# Patient Record
Sex: Male | Born: 1960 | Race: Black or African American | Hispanic: No | Marital: Married | Smoking: Never smoker
Health system: Southern US, Community
[De-identification: ages and names within clinical notes are randomized; demographics above are authoritative.]

## PROBLEM LIST (undated history)

## (undated) DIAGNOSIS — R002 Palpitations: Secondary | ICD-10-CM

## (undated) DIAGNOSIS — Z9989 Dependence on other enabling machines and devices: Secondary | ICD-10-CM

## (undated) DIAGNOSIS — G4733 Obstructive sleep apnea (adult) (pediatric): Secondary | ICD-10-CM

## (undated) DIAGNOSIS — K219 Gastro-esophageal reflux disease without esophagitis: Secondary | ICD-10-CM

## (undated) DIAGNOSIS — F419 Anxiety disorder, unspecified: Secondary | ICD-10-CM

## (undated) DIAGNOSIS — I82409 Acute embolism and thrombosis of unspecified deep veins of unspecified lower extremity: Secondary | ICD-10-CM

## (undated) HISTORY — DX: Palpitations: R00.2

## (undated) HISTORY — DX: Anxiety disorder, unspecified: F41.9

## (undated) HISTORY — DX: Acute embolism and thrombosis of unspecified deep veins of unspecified lower extremity: I82.409

## (undated) HISTORY — DX: Gastro-esophageal reflux disease without esophagitis: K21.9

## (undated) HISTORY — PX: NASAL SEPTOPLASTY W/ TURBINOPLASTY: SHX2070

## (undated) HISTORY — DX: Dependence on other enabling machines and devices: Z99.89

## (undated) HISTORY — PX: TONSILLECTOMY: SUR1361

## (undated) HISTORY — DX: Obstructive sleep apnea (adult) (pediatric): G47.33

---

## 2003-03-23 ENCOUNTER — Encounter: Payer: Self-pay | Admitting: Internal Medicine

## 2003-04-10 ENCOUNTER — Ambulatory Visit (HOSPITAL_BASED_OUTPATIENT_CLINIC_OR_DEPARTMENT_OTHER): Admission: RE | Admit: 2003-04-10 | Discharge: 2003-04-10 | Payer: Self-pay | Admitting: Internal Medicine

## 2004-01-19 ENCOUNTER — Ambulatory Visit: Payer: Self-pay | Admitting: Family Medicine

## 2004-02-21 ENCOUNTER — Ambulatory Visit: Payer: Self-pay | Admitting: Family Medicine

## 2004-02-21 ENCOUNTER — Ambulatory Visit (HOSPITAL_COMMUNITY): Admission: RE | Admit: 2004-02-21 | Discharge: 2004-02-21 | Payer: Self-pay | Admitting: Family Medicine

## 2004-02-26 ENCOUNTER — Ambulatory Visit: Payer: Self-pay | Admitting: Family Medicine

## 2004-02-29 ENCOUNTER — Ambulatory Visit: Payer: Self-pay | Admitting: Internal Medicine

## 2004-03-04 ENCOUNTER — Ambulatory Visit: Payer: Self-pay | Admitting: Internal Medicine

## 2004-03-11 ENCOUNTER — Ambulatory Visit: Payer: Self-pay | Admitting: Internal Medicine

## 2004-03-25 ENCOUNTER — Ambulatory Visit: Payer: Self-pay | Admitting: Family Medicine

## 2004-04-22 ENCOUNTER — Ambulatory Visit: Payer: Self-pay | Admitting: Family Medicine

## 2004-05-24 ENCOUNTER — Ambulatory Visit: Payer: Self-pay | Admitting: Family Medicine

## 2004-06-24 ENCOUNTER — Ambulatory Visit: Payer: Self-pay | Admitting: Family Medicine

## 2004-08-21 ENCOUNTER — Emergency Department (HOSPITAL_COMMUNITY): Admission: EM | Admit: 2004-08-21 | Discharge: 2004-08-21 | Payer: Self-pay | Admitting: Emergency Medicine

## 2004-08-23 ENCOUNTER — Ambulatory Visit: Payer: Self-pay | Admitting: Internal Medicine

## 2004-10-29 ENCOUNTER — Ambulatory Visit: Payer: Self-pay | Admitting: Internal Medicine

## 2005-07-16 ENCOUNTER — Ambulatory Visit (HOSPITAL_COMMUNITY): Admission: RE | Admit: 2005-07-16 | Discharge: 2005-07-17 | Payer: Self-pay | Admitting: Otolaryngology

## 2005-07-16 ENCOUNTER — Encounter (INDEPENDENT_AMBULATORY_CARE_PROVIDER_SITE_OTHER): Payer: Self-pay | Admitting: *Deleted

## 2006-03-12 ENCOUNTER — Encounter: Payer: Self-pay | Admitting: Internal Medicine

## 2006-06-02 ENCOUNTER — Encounter: Payer: Self-pay | Admitting: Internal Medicine

## 2006-07-09 DIAGNOSIS — G4733 Obstructive sleep apnea (adult) (pediatric): Secondary | ICD-10-CM | POA: Insufficient documentation

## 2006-07-09 DIAGNOSIS — J309 Allergic rhinitis, unspecified: Secondary | ICD-10-CM | POA: Insufficient documentation

## 2006-07-09 DIAGNOSIS — I82409 Acute embolism and thrombosis of unspecified deep veins of unspecified lower extremity: Secondary | ICD-10-CM | POA: Insufficient documentation

## 2006-07-23 IMAGING — CT CT ANGIO CHEST
1 of 6 series · 5 of 36 positions shown · IV contrast (omnipaque)
Comparison: Comparing chest radiograph of 08/21/04.

CLINICAL DATA: Left scapular pain.  History of DVT six months ago.  Shortness of breath.  
 CT ANGIOGRAPHY OF CHEST:
TECHNIQUE: Multidetector CT imaging of the chest was performed during bolus injection of intravenous contrast.  Multiplanar CT angiographic image reconstructions were generated to evaluate the vascular anatomy.
 Contrast:  100 cc Omnipaque 300.

[Series 3: pe 1.0 b20f st · axial · 0.63mm/px · z∈[-127,+81]mm · 5 of 312 slices shown]
[im 52/312  lung]
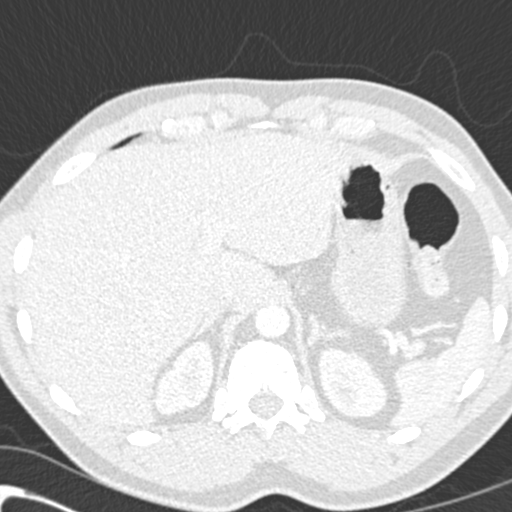
[im 104/312  mediastinal]
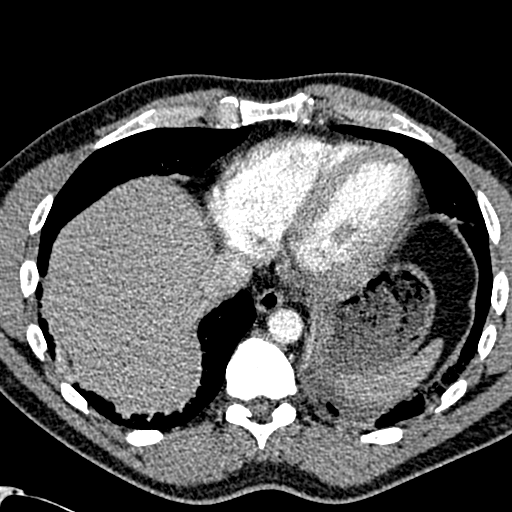
[im 156/312  lung]
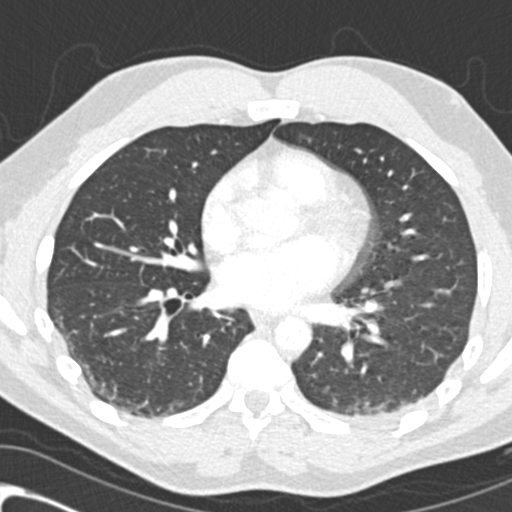
[im 208/312  mediastinal]
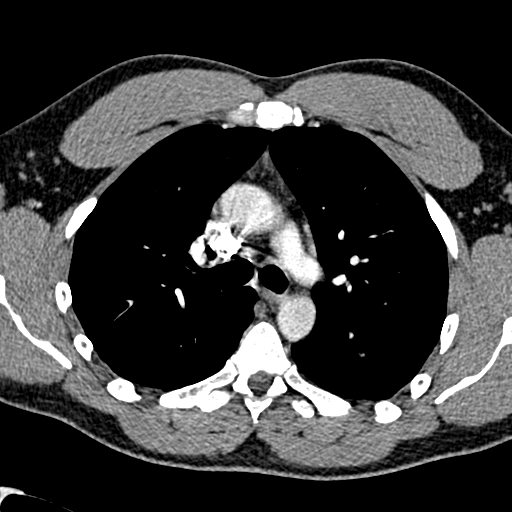
[im 260/312  lung]
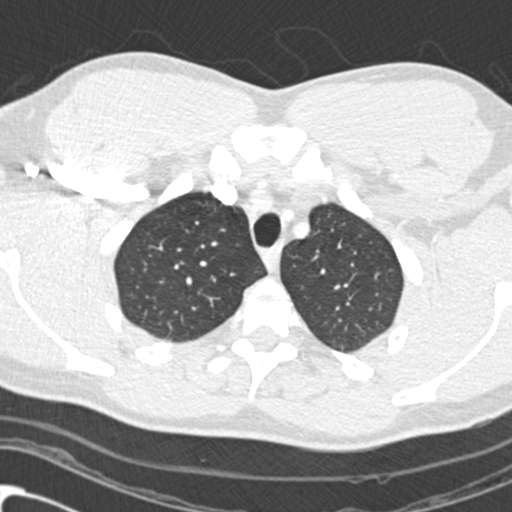

[5 of 36 positions shown; findings below may reference images not displayed]

FINDINGS: No filling defect is identified in the pulmonary arterial tree to suggest pulmonary embolus.  No aortic dissection is identified.  No mediastinal or hilar adenopathy.  
 There is some linear subsegmental atelectasis in the posterior basal segment of the right lower lobe.
 There is some ill-defined peripheral airspace opacity in the posterior basal segment in the left lower lobe suspicious for pneumonia or possibly atelectasis.
IMPRESSION: 1.  Left lower lobe airspace opacity suspicious for pneumonia and less likely to simply be a manifestation of atelectasis.
 2.  Mild basilar atelectasis.
 3.  No embolus is evident.

## 2006-08-24 ENCOUNTER — Encounter: Payer: Self-pay | Admitting: Internal Medicine

## 2006-10-14 ENCOUNTER — Encounter: Payer: Self-pay | Admitting: Internal Medicine

## 2006-10-18 ENCOUNTER — Emergency Department (HOSPITAL_COMMUNITY): Admission: EM | Admit: 2006-10-18 | Discharge: 2006-10-18 | Payer: Self-pay | Admitting: Emergency Medicine

## 2006-10-26 ENCOUNTER — Encounter: Payer: Self-pay | Admitting: Internal Medicine

## 2006-11-05 ENCOUNTER — Ambulatory Visit: Payer: Self-pay | Admitting: Internal Medicine

## 2006-11-05 DIAGNOSIS — K219 Gastro-esophageal reflux disease without esophagitis: Secondary | ICD-10-CM | POA: Insufficient documentation

## 2006-11-27 ENCOUNTER — Encounter: Payer: Self-pay | Admitting: Internal Medicine

## 2006-12-21 ENCOUNTER — Encounter: Payer: Self-pay | Admitting: Internal Medicine

## 2006-12-28 ENCOUNTER — Encounter: Payer: Self-pay | Admitting: Internal Medicine

## 2007-02-08 ENCOUNTER — Emergency Department (HOSPITAL_COMMUNITY): Admission: EM | Admit: 2007-02-08 | Discharge: 2007-02-08 | Payer: Self-pay | Admitting: Emergency Medicine

## 2007-02-22 ENCOUNTER — Encounter: Payer: Self-pay | Admitting: Internal Medicine

## 2007-02-26 ENCOUNTER — Ambulatory Visit: Payer: Self-pay | Admitting: Internal Medicine

## 2007-02-26 DIAGNOSIS — R002 Palpitations: Secondary | ICD-10-CM | POA: Insufficient documentation

## 2007-02-26 DIAGNOSIS — F411 Generalized anxiety disorder: Secondary | ICD-10-CM | POA: Insufficient documentation

## 2007-03-10 ENCOUNTER — Encounter: Payer: Self-pay | Admitting: Internal Medicine

## 2007-05-26 ENCOUNTER — Encounter (INDEPENDENT_AMBULATORY_CARE_PROVIDER_SITE_OTHER): Payer: Self-pay | Admitting: *Deleted

## 2007-06-11 ENCOUNTER — Ambulatory Visit: Payer: Self-pay | Admitting: Internal Medicine

## 2007-08-23 ENCOUNTER — Encounter: Payer: Self-pay | Admitting: Internal Medicine

## 2007-08-24 ENCOUNTER — Encounter: Payer: Self-pay | Admitting: Internal Medicine

## 2007-11-19 ENCOUNTER — Telehealth (INDEPENDENT_AMBULATORY_CARE_PROVIDER_SITE_OTHER): Payer: Self-pay | Admitting: *Deleted

## 2007-11-19 ENCOUNTER — Emergency Department (HOSPITAL_COMMUNITY): Admission: EM | Admit: 2007-11-19 | Discharge: 2007-11-19 | Payer: Self-pay | Admitting: Emergency Medicine

## 2007-12-30 ENCOUNTER — Encounter (INDEPENDENT_AMBULATORY_CARE_PROVIDER_SITE_OTHER): Payer: Self-pay | Admitting: *Deleted

## 2008-01-07 ENCOUNTER — Ambulatory Visit: Payer: Self-pay | Admitting: Internal Medicine

## 2008-01-07 DIAGNOSIS — R229 Localized swelling, mass and lump, unspecified: Secondary | ICD-10-CM | POA: Insufficient documentation

## 2008-03-02 ENCOUNTER — Ambulatory Visit: Payer: Self-pay | Admitting: Internal Medicine

## 2008-03-06 ENCOUNTER — Encounter: Payer: Self-pay | Admitting: Internal Medicine

## 2008-03-07 ENCOUNTER — Encounter (INDEPENDENT_AMBULATORY_CARE_PROVIDER_SITE_OTHER): Payer: Self-pay | Admitting: *Deleted

## 2008-03-07 LAB — CONVERTED CEMR LAB
BUN: 11 mg/dL (ref 6–23)
Basophils Absolute: 0 10*3/uL (ref 0.0–0.1)
Basophils Relative: 0 % (ref 0.0–3.0)
CO2: 28 meq/L (ref 19–32)
Calcium: 9.3 mg/dL (ref 8.4–10.5)
Chloride: 108 meq/L (ref 96–112)
Cholesterol: 109 mg/dL (ref 0–200)
Creatinine, Ser: 0.9 mg/dL (ref 0.4–1.5)
Eosinophils Absolute: 0.1 10*3/uL (ref 0.0–0.7)
Eosinophils Relative: 1.4 % (ref 0.0–5.0)
GFR calc Af Amer: 117 mL/min
GFR calc non Af Amer: 97 mL/min
Glucose, Bld: 90 mg/dL (ref 70–99)
HCT: 39.2 % (ref 39.0–52.0)
HDL: 39.7 mg/dL (ref >39.0)
Hemoglobin: 13.1 g/dL (ref 13.0–17.0)
LDL Cholesterol: 59 mg/dL (ref 0–99)
Lymphocytes Relative: 35.9 % (ref 12.0–46.0)
MCHC: 33.3 g/dL (ref 30.0–36.0)
MCV: 93.7 fL (ref 78.0–100.0)
Monocytes Absolute: 0.4 10*3/uL (ref 0.1–1.0)
Monocytes Relative: 10.9 % (ref 3.0–12.0)
Neutro Abs: 1.9 10*3/uL (ref 1.4–7.7)
Neutrophils Relative %: 51.8 % (ref 43.0–77.0)
Platelets: 150 10*3/uL (ref 150–400)
Potassium: 4.4 meq/L (ref 3.5–5.1)
RBC: 4.18 M/uL — ABNORMAL LOW (ref 4.22–5.81)
RDW: 11.6 % (ref 11.5–14.6)
Sodium: 142 meq/L (ref 135–145)
Total CHOL/HDL Ratio: 2.7
Triglycerides: 51 mg/dL (ref 0–149)
VLDL: 10 mg/dL (ref 0–40)
WBC: 3.8 10*3/uL — ABNORMAL LOW (ref 4.5–10.5)

## 2008-03-26 ENCOUNTER — Emergency Department (HOSPITAL_COMMUNITY): Admission: EM | Admit: 2008-03-26 | Discharge: 2008-03-26 | Payer: Self-pay | Admitting: Emergency Medicine

## 2008-04-27 ENCOUNTER — Telehealth: Payer: Self-pay | Admitting: Internal Medicine

## 2008-05-12 ENCOUNTER — Encounter: Payer: Self-pay | Admitting: Internal Medicine

## 2008-08-14 ENCOUNTER — Encounter: Payer: Self-pay | Admitting: Internal Medicine

## 2008-09-05 ENCOUNTER — Encounter (INDEPENDENT_AMBULATORY_CARE_PROVIDER_SITE_OTHER): Payer: Self-pay | Admitting: *Deleted

## 2008-09-05 ENCOUNTER — Ambulatory Visit: Payer: Self-pay | Admitting: Internal Medicine

## 2009-01-09 IMAGING — CR DG CHEST 2V
2 series · 2 of 2 positions shown · non-contrast
Comparison: 07/10/05.

CLINICAL DATA: Palpitations and light-headedness.  
 CHEST - 2 VIEW:

[w chest pa]
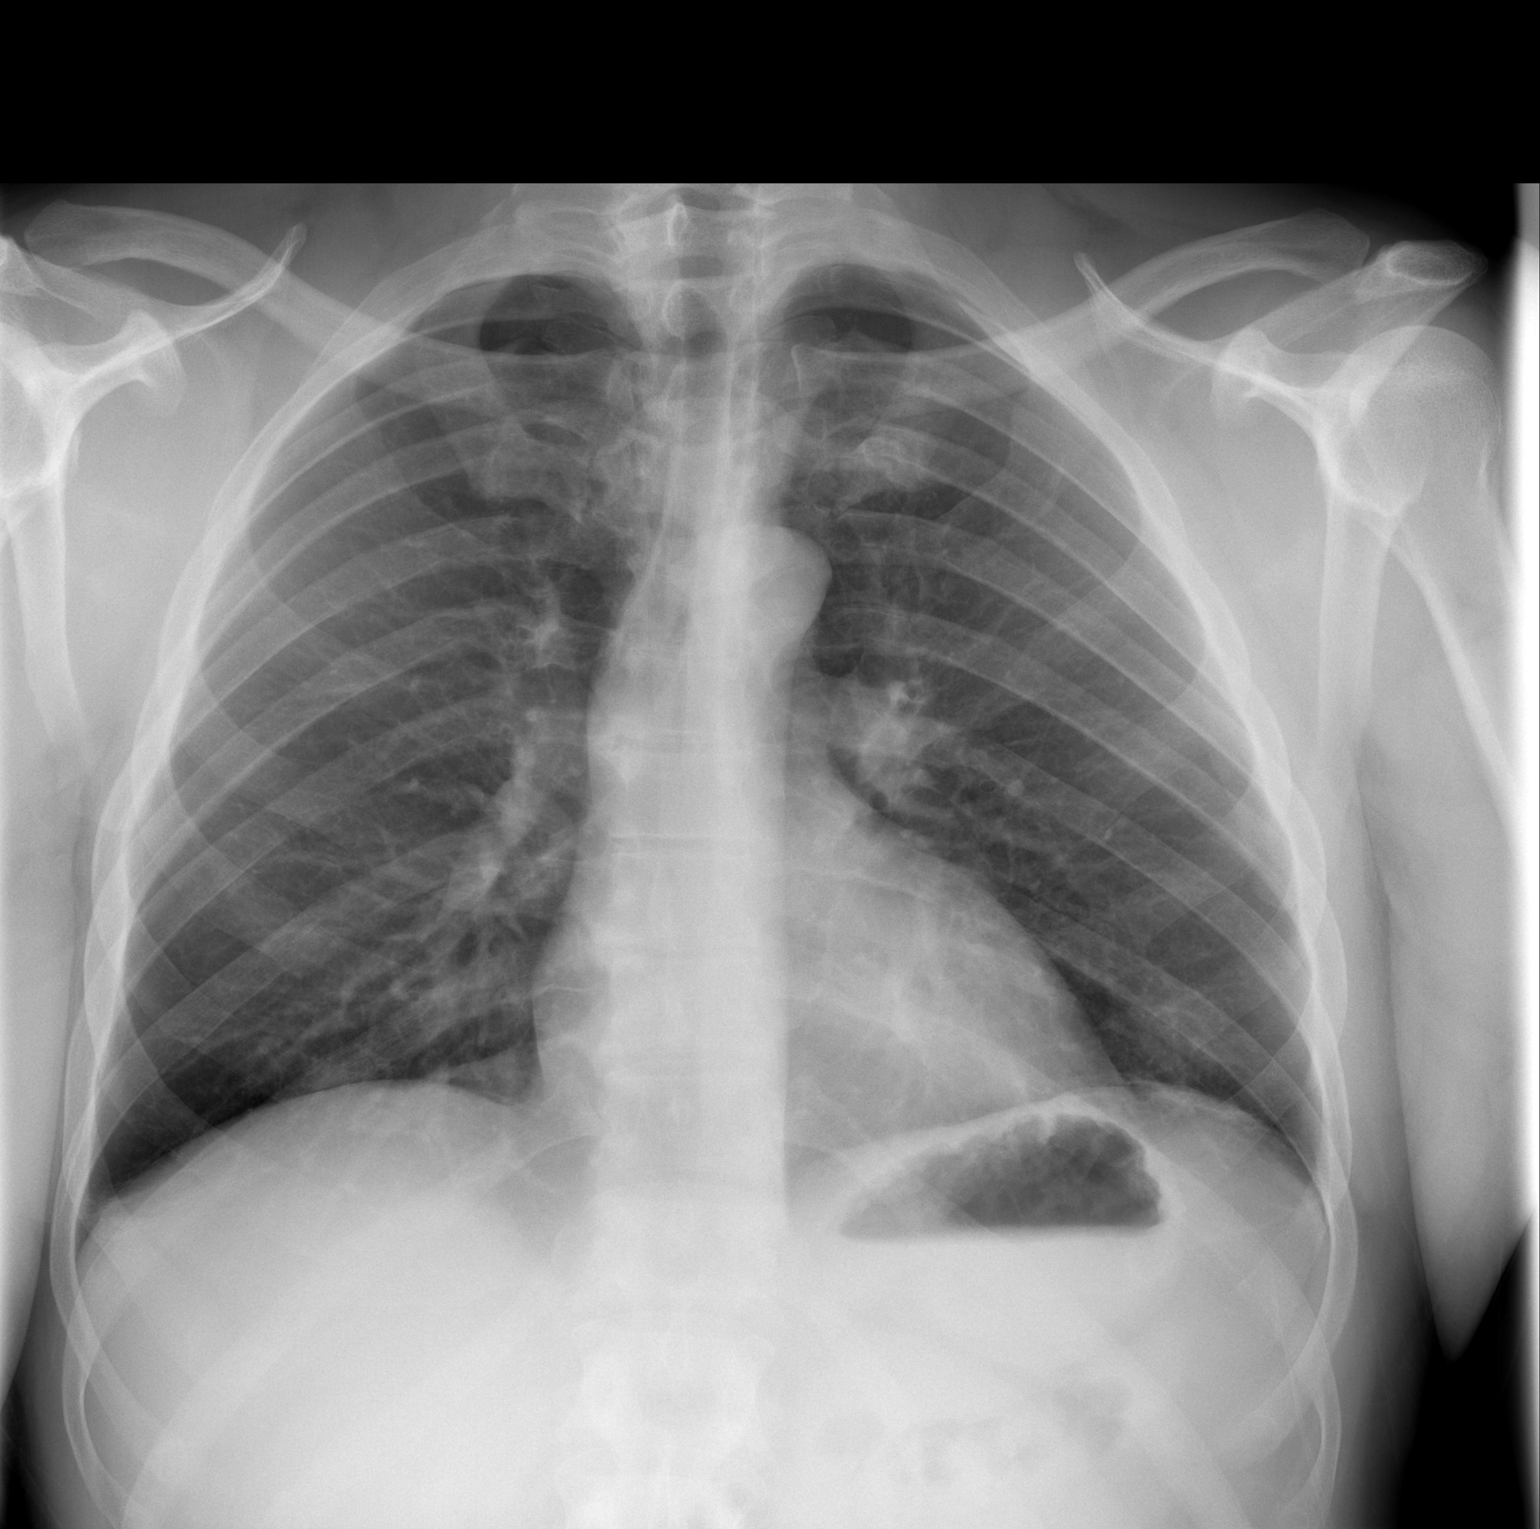

[w chest lat]
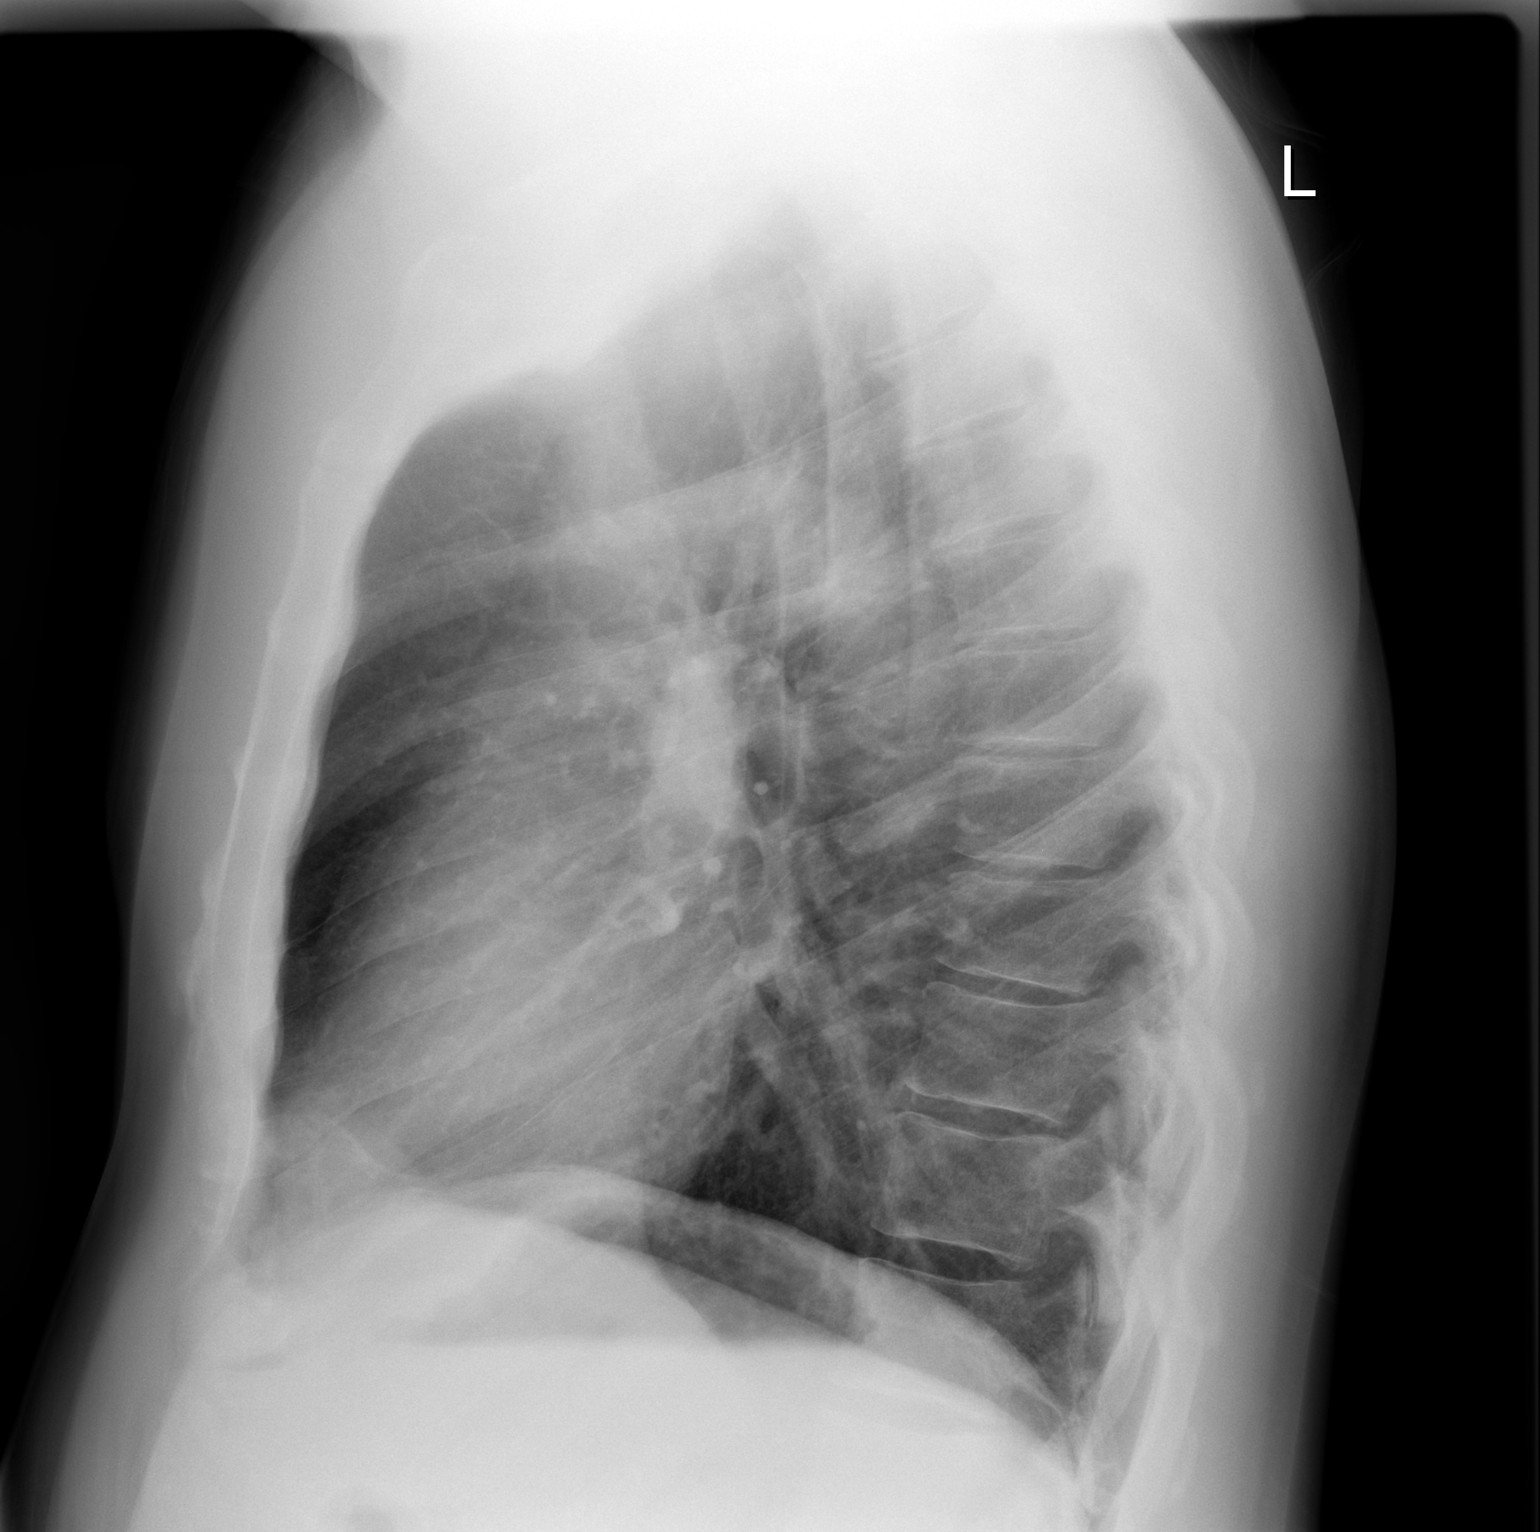

[2 of 2 positions shown; findings below may reference images not displayed]

FINDINGS: The lungs are clear.  Heart size is normal.  No pleural effusion or focal bony abnormality.
IMPRESSION: No acute disease.

## 2009-01-15 ENCOUNTER — Encounter: Payer: Self-pay | Admitting: Internal Medicine

## 2009-02-22 ENCOUNTER — Telehealth (INDEPENDENT_AMBULATORY_CARE_PROVIDER_SITE_OTHER): Payer: Self-pay | Admitting: *Deleted

## 2009-03-20 ENCOUNTER — Ambulatory Visit: Payer: Self-pay | Admitting: Internal Medicine

## 2009-03-22 LAB — CONVERTED CEMR LAB
ALT: 46 units/L (ref 0–53)
AST: 50 units/L — ABNORMAL HIGH (ref 0–37)
BUN: 12 mg/dL (ref 6–23)
Basophils Absolute: 0 10*3/uL (ref 0.0–0.1)
Basophils Relative: 0.4 % (ref 0.0–3.0)
CO2: 30 meq/L (ref 19–32)
Calcium: 9.4 mg/dL (ref 8.4–10.5)
Chloride: 106 meq/L (ref 96–112)
Cholesterol: 121 mg/dL (ref 0–200)
Creatinine, Ser: 1 mg/dL (ref 0.4–1.5)
Eosinophils Absolute: 0.1 10*3/uL (ref 0.0–0.7)
Eosinophils Relative: 1.4 % (ref 0.0–5.0)
GFR calc non Af Amer: 103.37 mL/min (ref >60)
Glucose, Bld: 82 mg/dL (ref 70–99)
HCT: 41 % (ref 39.0–52.0)
HDL: 42.9 mg/dL (ref >39.00)
Hemoglobin: 13.4 g/dL (ref 13.0–17.0)
LDL Cholesterol: 59 mg/dL (ref 0–99)
Lymphocytes Relative: 35.6 % (ref 12.0–46.0)
Lymphs Abs: 1.5 10*3/uL (ref 0.7–4.0)
MCHC: 32.8 g/dL (ref 30.0–36.0)
MCV: 93.9 fL (ref 78.0–100.0)
Monocytes Absolute: 0.5 10*3/uL (ref 0.1–1.0)
Monocytes Relative: 11.9 % (ref 3.0–12.0)
Neutro Abs: 2.2 10*3/uL (ref 1.4–7.7)
Neutrophils Relative %: 50.7 % (ref 43.0–77.0)
Platelets: 161 10*3/uL (ref 150.0–400.0)
Potassium: 4.1 meq/L (ref 3.5–5.1)
RBC: 4.36 M/uL (ref 4.22–5.81)
RDW: 11.9 % (ref 11.5–14.6)
Sodium: 140 meq/L (ref 135–145)
TSH: 1.54 microintl units/mL (ref 0.35–5.50)
Total CHOL/HDL Ratio: 3
Triglycerides: 95 mg/dL (ref 0.0–149.0)
VLDL: 19 mg/dL (ref 0.0–40.0)
WBC: 4.3 10*3/uL — ABNORMAL LOW (ref 4.5–10.5)

## 2009-03-28 ENCOUNTER — Ambulatory Visit: Payer: Self-pay | Admitting: Internal Medicine

## 2009-04-18 ENCOUNTER — Encounter: Payer: Self-pay | Admitting: Internal Medicine

## 2009-04-23 ENCOUNTER — Encounter: Payer: Self-pay | Admitting: Internal Medicine

## 2009-08-15 ENCOUNTER — Ambulatory Visit: Payer: Self-pay | Admitting: Internal Medicine

## 2009-08-15 DIAGNOSIS — R197 Diarrhea, unspecified: Secondary | ICD-10-CM | POA: Insufficient documentation

## 2010-01-23 ENCOUNTER — Encounter: Payer: Self-pay | Admitting: Internal Medicine

## 2010-03-17 LAB — CONVERTED CEMR LAB
ALT: 22 units/L (ref 0–53)
AST: 24 units/L (ref 0–37)
BUN: 10 mg/dL (ref 6–23)
Basophils Absolute: 0 10*3/uL (ref 0.0–0.1)
Basophils Relative: 0.3 % (ref 0.0–1.0)
CO2: 27 meq/L (ref 19–32)
Calcium: 9.8 mg/dL (ref 8.4–10.5)
Chloride: 104 meq/L (ref 96–112)
Cholesterol: 99 mg/dL (ref 0–200)
Creatinine, Ser: 1 mg/dL (ref 0.4–1.5)
Eosinophils Absolute: 0 10*3/uL (ref 0.0–0.6)
Eosinophils Relative: 0.6 % (ref 0.0–5.0)
GFR calc Af Amer: 104 mL/min
GFR calc non Af Amer: 86 mL/min
Glucose, Bld: 87 mg/dL (ref 70–99)
HCT: 41.5 % (ref 39.0–52.0)
HDL: 33.4 mg/dL — ABNORMAL LOW (ref >39.0)
Hemoglobin: 13.6 g/dL (ref 13.0–17.0)
LDL Cholesterol: 53 mg/dL (ref 0–99)
Lymphocytes Relative: 32.1 % (ref 12.0–46.0)
MCHC: 32.7 g/dL (ref 30.0–36.0)
MCV: 93.5 fL (ref 78.0–100.0)
Monocytes Absolute: 0.4 10*3/uL (ref 0.2–0.7)
Monocytes Relative: 11.5 % — ABNORMAL HIGH (ref 3.0–11.0)
Neutro Abs: 2 10*3/uL (ref 1.4–7.7)
Neutrophils Relative %: 55.5 % (ref 43.0–77.0)
Platelets: 167 10*3/uL (ref 150–400)
Potassium: 3.9 meq/L (ref 3.5–5.1)
RBC: 4.44 M/uL (ref 4.22–5.81)
RDW: 11.8 % (ref 11.5–14.6)
Sodium: 139 meq/L (ref 135–145)
TSH: 1.17 microintl units/mL (ref 0.35–5.50)
Total CHOL/HDL Ratio: 3
Triglycerides: 64 mg/dL (ref 0–149)
VLDL: 13 mg/dL (ref 0–40)
WBC: 3.5 10*3/uL — ABNORMAL LOW (ref 4.5–10.5)

## 2010-03-21 NOTE — Letter (Signed)
Summary: Cancer Screening/[facility] Personalized Risk Profile  Cancer Screening/[facility] Personalized Risk Profile   Imported By: Lanelle Bal 03/06/2008 13:34:21  _____________________________________________________________________  External Attachment:    Type:   Image     Comment:   External Document

## 2010-03-21 NOTE — Letter (Signed)
Summary: [facility]  [facility]   Imported By: Freddy Jaksch 04/26/2007 08:38:06  _____________________________________________________________________  External Attachment:    Type:   Image     Comment:   External Document

## 2010-03-21 NOTE — Letter (Signed)
Summary: Primary Care Appointment Letter  [facility] [place]  [address] [place], [place] [address]   Phone: [phone]  Fax: [phone]    12/30/2007 MRN: [id number]  Ronnie [name] [address] [place], [place]  [address]  Dear [name],   Your Primary Care Physician  has indicated that:    _______it is time to schedule an appointment.    _______you missed your appointment on______ and need to call and          reschedule.    _______you need to have lab work done.    _______you need to schedule an appointment discuss lab or test results.    _______your appointment with Dr. Drue Novel on 02/25/2008 was rescheduled to 03/03/07 @ 10:00am      Please call our office if any questions regarding appointment. Our phone number is [phone]      Thank you,    [facility] Primary Care Scheduler

## 2010-03-21 NOTE — Letter (Signed)
Summary: CMN for CPAP/[facility]  CMN for CPAP/[facility]   Imported By: Lanelle Bal 04/23/2009 13:11:10  _____________________________________________________________________  External Attachment:    Type:   Image     Comment:   External Document

## 2010-03-21 NOTE — Consult Note (Signed)
Summary: [facility]  [facility]   Imported By: Freddy Jaksch 08/27/2007 15:09:50  _____________________________________________________________________  External Attachment:    Type:   Image     Comment:   External Document

## 2010-03-21 NOTE — Assessment & Plan Note (Signed)
Summary: follow up//PH   Vital Signs:  Patient Profile:   50 Years Old Male Height:     75 inches Weight:      203.6 pounds Pulse rate:   58 / minute BP sitting:   100 / 60  Vitals Entered By: Shary Decamp (June 11, 2007 8:54 AM)                 Chief Complaint:  rov fasting.  History of Present Illness: rov anxiety better didn't get to see the counselor  self d/c singulair-zegerid for asthma and AR: still feels well life style has improved a lot     Updated Prior Medication List: ATENOLOL 25 MG  TABS (ATENOLOL) 1 by mouth qd PROAIR HFA 108 (90 BASE) MCG/ACT  AERS (ALBUTEROL SULFATE) 1-2 puffs q4-6h prn * MVI   Current Allergies (reviewed today): ! * WHEAT ! * SHELLFISH ! * PEANUTS ! * EGGS      Physical Exam  General:     alert and well-developed.   Psych:     Oriented X3, good eye contact, not anxious appearing, and not depressed appearing.      Impression & Recommendations:  Problem # 1:  ANXIETY (ICD-300.00) a lot better, healthy life style is helping  Problem # 2:  labs discussed w/ pt doing great f/u 02-2008  Complete Medication List: 1)  Atenolol 25 Mg Tabs (Atenolol) .Marland Kitchen.. 1 by mouth qd 2)  Proair Hfa 108 (90 Base) Mcg/act Aers (Albuterol sulfate) .Marland Kitchen.. 1-2 puffs q4-6h prn 3)  Mvi    Patient Instructions: 1)  Please schedule a follow-up appointment in 9 months--by January 2010    ]

## 2010-03-21 NOTE — Letter (Signed)
Summary: [facility]  [facility]   Imported By: Lanelle Bal 02/14/2010 12:50:31  _____________________________________________________________________  External Attachment:    Type:   Image     Comment:   External Document

## 2010-03-21 NOTE — Letter (Signed)
Summary: [facility]--Office visit  [facility]--Office visit   Imported By: Freddy Jaksch 03/05/2007 08:44:36  _____________________________________________________________________  External Attachment:    Type:   Image     Comment:   Inter

## 2010-03-21 NOTE — Letter (Signed)
Summary: [facility]  [facility]   Imported By: Lanelle Bal 08/29/2008 15:05:11  _____________________________________________________________________  External Attachment:    Type:   Image     Comment:   External Document

## 2010-03-21 NOTE — Letter (Signed)
Summary: GI    [facility]OFFICE [facility]  External Correspondence-[facility]OFFICE [facility]   Imported By: Vanessa Swaziland 12/29/2006 11:42:47  _____________________________________________________________________  External Attachment:    Type:   Image     Comment:   External Document

## 2010-03-21 NOTE — Letter (Signed)
Summary: Results Follow up Letter  [facility] [place]  [address] [place], [place] [address]   Phone: [phone]  Fax: [phone]    03/07/2008 MRN: [id number]  Ronnie [name] [address] [place], [place]  [address]  Dear Ronnie [name],  The following are the results of your recent test(s):  Test         Result    Pap Smear:        Normal _____  Not Normal _____ Comments: ______________________________________________________ Cholesterol: LDL(Bad cholesterol):         Your goal is less than:         HDL (Good cholesterol):       Your goal is more than: Comments:  ______________________________________________________ Mammogram:        Normal _____  Not Normal _____ Comments:  ___________________________________________________________________ Hemoccult:        Normal _____  Not normal _______ Comments:    _____________________________________________________________________ Other Tests:  see attached labs & comments  We routinely do not discuss normal results over the telephone.  If you desire a copy of the results, or you have any questions about this information we can discuss them at your next office visit.   Sincerely,

## 2010-03-21 NOTE — Letter (Signed)
Summary: GI Dr. Kinnie Scales  External Correspondence   Imported By: Vanessa Swaziland 10/23/2006 14:50:19  _____________________________________________________________________  External Attachment:    Type:   Image     Comment:   External Document

## 2010-03-21 NOTE — Letter (Signed)
[facility]  [facility]   Imported By: Freddy Jaksch 03/10/2007 10:29:19  _____________________________________________________________________  External Attachment:    Type:   Image     Comment:   External Document

## 2010-03-21 NOTE — Letter (Signed)
Summary: [facility]  [facility]   Imported By: Lanelle Bal 02/07/2009 08:41:58  _____________________________________________________________________  External Attachment:    Type:   Image     Comment:   External Document

## 2010-03-21 NOTE — Assessment & Plan Note (Signed)
Summary: cpx/ns/kdc   Vital Signs:  Patient profile:   50 year old male Height:      75 inches Weight:      217.8 pounds BMI:     27.32 Pulse rate:   72 / minute BP sitting:   112 / 80  Vitals Entered By: Shary Decamp (March 28, 2009 1:35 PM) CC: cpx - not fasting Is Patient Diabetic? No   History of Present Illness: CPX  Preventive Screening-Counseling & Management  Alcohol-Tobacco     Passive Smoke Exposure: no  Caffeine-Diet-Exercise     Does Patient Exercise: yes     Times/week: 4      Drug Use:  no.    Current Medications (verified): 1)  Proair Hfa 108 (90 Base) Mcg/act  Aers (Albuterol Sulfate) .Marland Kitchen.. 1-2 Puffs Q4-6h Prn 2)  Mvi 3)  Nexium 40 Mg Cpdr (Esomeprazole Magnesium) .Marland Kitchen.. 1 Before Breakfast Every Day 4)  Cpap .... Setting: 10cm H2o W/humidifier-Mask-Choice of Supplies Dx 780.57 5)  Toprol Xl 25 Mg Xr24h-Tab (Metoprolol Succinate) .Marland Kitchen.. 1 By Mouth Once Daily  Allergies (verified): 1)  ! * Wheat 2)  ! * Shellfish 3)  ! * Peanuts 4)  ! * Eggs  Past History:  Past Medical History: Allergic rhinitis GERD OSA --on  CPAP DVT RLE 2002 aprox, (-) anticoag panel palpitations (on betablokers) Asthma Anxiety  Past Surgical History: Reviewed history from 02/26/2007 and no changes required. Tonsillectomy septoplasty, turbinate reduction, UPPP  Family History: Reviewed history from 02/26/2007 and no changes required. DM--no MI--no colon ca ca--no prostate ca-- father  Social History: Reviewed history from 02/26/2007 and no changes required. Married 1 child tobacco--never  ETOH-- rarely  exercise--very active at work, goes to [employer] x 4/week (gaining wt since)Does Patient Exercise:  yes Passive Smoke Exposure:  no Drug Use:  no  Review of Systems General:  Denies fatigue and fever. CV:  Denies chest pain or discomfort, palpitations, and swelling of feet. Resp:  asthma doing well , not using meds x > 1 year. GI:  Denies bloody stools,  diarrhea, nausea, and vomiting; GERD symptoms well controlled . GU:  Denies hematuria, urinary frequency, and urinary hesitancy. Psych:  Denies anxiety and depression; sleeps well .  Physical Exam  General:  alert, well-developed, and well-nourished.   Neck:  no masses, no thyromegaly, and normal carotid upstroke.   Lungs:  normal respiratory effort, no intercostal retractions, no accessory muscle use, and normal breath sounds.   Heart:  normal rate, regular rhythm, no murmur, and no gallop.   Abdomen:  soft, non-tender, no distention, no masses, no guarding, and no rigidity.   Extremities:  no pretibial edema bilaterally  Psych:  Cognition and judgment appear intact. Alert and cooperative with normal attention span and concentration. not anxious appearing and not depressed appearing.     Impression & Recommendations:  Problem # 1:  HEALTH SCREENING (ICD-V70.0) Td 2005 no flu shot "i don't do flu shots"   Cscope 10-08 per pt (2 polyps, next 5 years) and EGD 2008  (Dr Candelaria Stagers)  PSAs per Urology  (saw urolgy yesterday, PSA was ok)  all labs discussed, AST slightly  elevated, repeat yearly (patient is not a drinker)  rec continue w/ exercises-going to the gym printed material provided regards healthy diet  Complete Medication List: 1)  Proair Hfa 108 (90 Base) Mcg/act Aers (Albuterol sulfate) .Marland Kitchen.. 1-2 puffs q4-6h prn 2)  Mvi  3)  Nexium 40 Mg Cpdr (Esomeprazole magnesium) .Marland KitchenMarland KitchenMarland Kitchen 1  before breakfast every day 4)  Cpap  .... Setting: 10cm h2o w/humidifier-mask-choice of supplies dx 780.57 5)  Toprol Xl 25 Mg Xr24h-tab (Metoprolol succinate) .Marland Kitchen.. 1 by mouth once daily  Patient Instructions: 1)  Please schedule a follow-up appointment in 1 year.    Preventive Care Screening  Prior Values:    Last Tetanus Booster:  Historical (03/23/2003)    Last Flu Shot:  declined (explained benefits "I get sick") (02/26/2007)    Risk Factors:  Tobacco use:  never Passive smoke exposure:   no Drug use:  no Alcohol use:  no Exercise:  yes    Times per week:  4

## 2010-03-21 NOTE — Assessment & Plan Note (Signed)
Summary: cpx//tl   Vital Signs:  Patient Profile:   50 Years Old Male Height:     75 inches Weight:      214 pounds BMI:     26.84 Pulse rate:   64 / minute BP sitting:   120 / 80  Vitals Entered By: Shary Decamp (February 26, 2007 8:51 AM)                 Chief Complaint:  cpx - fasting.  History of Present Illness: CPX  multiple allergies--asthma: peanuts shellfish eggs wheat stress about this  tired restlessness hard to sleep  Current Allergies (reviewed today): ! * WHEAT ! * SHELLFISH ! * PEANUTS ! * EGGS  Past Medical History:    Allergic rhinitis    GERD    OSA     DVT RLE, (-) anticoag panel    palpitations (on betablokers)    Asthma    Anxiety  Past Surgical History:    Reviewed history from 07/09/2006 and no changes required:       Tonsillectomy       septoplasty, turbinate reduction, UPPP   Family History:    DM--no    MI--no    colon ca ca--no    prostate ca-- father  Social History:    Married    1 child    very active at work   Risk Factors:  Tobacco use:  never Alcohol use:  no   Review of Systems  General      other than below, ROS  is negative   CV      Denies chest pain or discomfort.      no calf pain or swelling  Resp      occ SOB "sometimes from food sometimes from been anxious" wheezing more frecuent in the last 2 months  GI      GERD sx well control  Neuro      occ HAs  Psych      Complains of anxiety.      occ tearful--paniky   Physical Exam  General:     alert, well-developed, and well-nourished.   Lungs:     normal respiratory effort, no intercostal retractions, no accessory muscle use, and normal breath sounds.   Heart:     normal rate, regular rhythm, no murmur, and no gallop.   Abdomen:     soft, non-tender, no hepatomegaly, and no splenomegaly.   Extremities:     no edema Neurologic:     alert & oriented X3, cranial nerves II-XII intact, strength normal in all extremities, and  gait normal.   Psych:     good eye contact.  seems slt anxious and "down"    Impression & Recommendations:  Problem # 1:  HEALTH SCREENING (ICD-V70.0) Cscope 10-08 per pt (2 polyps, next 5 years) and EGD 2008  (Dr Candelaria Stagers) PSAs per Urology EKG: sent to cards for over read Orders: TLB-Lipid Panel (80061-LIPID) TLB-BMP (Basic Metabolic Panel-BMET) (80048-METABOL) TLB-CBC Platelet - w/Differential (85025-CBCD) TLB-TSH (Thyroid Stimulating Hormone) (84443-TSH) TLB-ALT (SGPT) (84460-ALT) TLB-AST (SGOT) (84450-SGOT) EKG w/ Interpretation (93000)   Problem # 2:  GERD (ICD-530.81) well control had EGD 2008 His updated medication list for this problem includes:    Zegerid 40-1100 Mg Caps (Omeprazole-sodium bicarbonate) .Marland Kitchen... 1 by mouth qd   Problem # 3:  ALLERGIC RHINITIS (ICD-477.9) allergies per Dr. Cordova Callas on weekly shots  Problem # 4:  ASTHMA (ICD-493.90) per Dr.  Callas His updated medication list for  this problem includes:    Singulair 10 Mg Tabs (Montelukast sodium) .Marland Kitchen... 1 by mouth qd    Proair Hfa 108 (90 Base) Mcg/act Aers (Albuterol sulfate) .Marland Kitchen... 1-2 puffs q4-6h prn   Problem # 5:  ANXIETY (ICD-300.00) anxiety > depression declined meds for now counseled referal to counselor Orders: Psychology Referral (Psychology)   Problem # 6:  SLEEP APNEA (ICD-780.57) back using a CPAP  Complete Medication List: 1)  Zegerid 40-1100 Mg Caps (Omeprazole-sodium bicarbonate) .Marland Kitchen.. 1 by mouth qd 2)  Toprol Xl 25 Mg Tb24 (Metoprolol succinate) .Marland Kitchen.. 1 by mouth qd 3)  Singulair 10 Mg Tabs (Montelukast sodium) .Marland Kitchen.. 1 by mouth qd 4)  Meclizine Hcl 25 Mg Tabs (Meclizine hcl) .... Bid 5)  Proair Hfa 108 (90 Base) Mcg/act Aers (Albuterol sulfate) .Marland Kitchen.. 1-2 puffs q4-6h prn 6)  Advair  .... Bid 7)  Mvi    Patient Instructions: 1)  Please schedule a follow-up appointment in 3 months to check on anxiety    ]  Influenza Immunization History:    Influenza # 1:  declined  (explained benefits "I get sick") (02/26/2007)

## 2010-03-21 NOTE — Progress Notes (Signed)
Summary: Phone-cpx labs  Phone Note Call from Patient   Caller: Patient Summary of Call: patient has a cpx on Feb 9,2011 and labs scheduled on Feb 1,2011. Need lab orders please. Initial call taken by: Barb Merino,  February 22, 2009 10:14 AM  Follow-up for Phone Call        Per last CPX - PSA per urology. Needs lipid, bmp, cbcd, tsh.......anything else? Shary Decamp  February 22, 2009 10:24 AM also AST, ALT. Jose E. Paz MD  February 26, 2009 3:07 PM     Additional Follow-up for Phone Call Additional follow up Details #2::    Labs are scheduled ....Marland KitchenMarland KitchenBarb Merino  February 26, 2009 3:46 PM  Follow-up by: Barb Merino,  February 26, 2009 3:46 PM

## 2010-03-21 NOTE — Letter (Signed)
Summary: [facility]   [facility] & [facility]   Imported By: Lanelle Bal 03/22/2008 12:44:26  _____________________________________________________________________  External Attachment:    Type:   Image     Comment:   External Document

## 2010-03-21 NOTE — Progress Notes (Signed)
Summary: PAZ-FEVEF, HEADACHE, DIARRHEA  Phone Note Call from Patient Call back at Work Phone [phone]   Caller: Patient Summary of Call: PATIENT IS CALLING WITH HEADACHE AND FEVER,DIARRHEA, WAKE UP WITH THIS AND LIKE TO KNOW WHAT SHOULD HE DO, hE HAVEN'T TOOK HIS TEMP. BUT HE KNOWS HE HAS A FEVER. Initial call taken by: Freddy Jaksch,  November 19, 2007 11:20 AM  Follow-up for Phone Call        spoke with patient advised he can take tylenol for fever and immodium for diarrhea but scheduled appt for sat. clinic tomorrow.......Marland KitchenDoristine Devoid  November 19, 2007 11:44 AM

## 2010-03-21 NOTE — Assessment & Plan Note (Signed)
Summary: CPX/CDJ   Vital Signs:  Patient Profile:   50 Years Old [name] Height:     75 inches Weight:      203 pounds Pulse rate:   61 / minute BP sitting:   122 / 60  (right arm)  Vitals Entered By: Shary Decamp (March 02, 2008 10:03 AM)                 Chief Complaint:  CPX.  History of Present Illness: CPX --states that GERD symptoms not well control; he quit Zegerid  (didn't like it) now on prilosec OTC: has hearburn, upper abd gas -- saw cards, 7-09, note reviewed     Updated Prior Medication List: PROAIR HFA 108 (Ronnie BASE) MCG/ACT  AERS (ALBUTEROL SULFATE) 1-2 puffs q4-6h prn * MVI   Current Allergies (reviewed today): ! * WHEAT ! * SHELLFISH ! * PEANUTS ! * EGGS  Past Medical History:    Reviewed history from 02/26/2007 and no changes required:       Allergic rhinitis       GERD       OSA        DVT RLE, (-) anticoag panel       palpitations (on betablokers)       Asthma       Anxiety  Past Surgical History:    Reviewed history from 02/26/2007 and no changes required:       Tonsillectomy       septoplasty, turbinate reduction, UPPP   Family History:    Reviewed history from 02/26/2007 and no changes required:       DM--no       MI--no       colon ca ca--no       prostate ca-- father  Social History:    Reviewed history from 02/26/2007 and no changes required:       Married       1 child       very active at work   Risk Factors: Tobacco use:  never Alcohol use:  no   Review of Systems  General      Denies fever.  CV      Denies chest pain or discomfort and palpitations.  Resp      Denies cough, shortness of breath, and wheezing.  GI      Denies bloody stools, diarrhea, and nausea.  GU      Denies hematuria, urinary frequency, and urinary hesitancy.  Psych      Denies anxiety and depression.      sleeps well most of the time    Physical Exam  General:     alert and well-developed.   Eyes:     EOMI not pale   Neck:     no masses, no thyromegaly, and normal carotid upstroke.   Lungs:     normal respiratory effort, no intercostal retractions, no accessory muscle use, and normal breath sounds.   Heart:     normal rate, regular rhythm, no murmur, and no gallop.   Abdomen:     soft, non-tender, no distention, no masses, no hepatomegaly, and no splenomegaly.   Extremities:     no pretibial edema bilaterally  Neurologic:     alert & oriented X3, strength normal in all extremities, and gait normal.   Psych:     Cognition and judgment appear intact. Alert and cooperative with normal attention span and concentration.     Impression & Recommendations:  Problem # 1:  HEALTH SCREENING (ICD-V70.0) Cscope 10-08 per pt (2 polyps, next 5 years) and EGD 2008  (Dr Candelaria Stagers) PSAs per Urology has change his diet , very active has lost some weight , doing great Td 2005 Flu shot--declined  H1N1--declined  benefits discussed  Orders: Venipuncture (47829) TLB-BMP (Basic Metabolic Panel-BMET) (80048-METABOL) TLB-CBC Platelet - w/Differential (85025-CBCD) TLB-Lipid Panel (80061-LIPID)   Problem # 2:  GERD (ICD-530.81) sx not well control printed material provided regards GERD precautions,switch to nexium, samples and a Rx  call if no better  His updated medication list for this problem includes:    Nexium 40 Mg Cpdr (Esomeprazole magnesium) .Marland Kitchen... 1 before breakfast every day   Complete Medication List: 1)  Proair Hfa 108 (Ronnie Base) Mcg/act Aers (Albuterol sulfate) .Marland Kitchen.. 1-2 puffs q4-6h prn 2)  Mvi  3)  Nexium 40 Mg Cpdr (Esomeprazole magnesium) .Marland Kitchen.. 1 before breakfast every day   Patient Instructions: 1)  Please schedule a follow-up appointment in 1 year.   Prescriptions: NEXIUM 40 MG CPDR (ESOMEPRAZOLE MAGNESIUM) 1 before breakfast every day  #Ronnie x 3   Entered and Authorized by:   Nolon Rod. Paz MD   Signed by:   Nolon Rod. Paz MD on 03/02/2008   Method used:   Print then Give to Patient   RxID:    [id number]

## 2010-03-21 NOTE — Letter (Signed)
Summary: [facility]  [facility]   Imported By: Lanelle Bal 06/01/2008 14:10:48  _____________________________________________________________________  External Attachment:    Type:   Image     Comment:   External Document

## 2010-03-21 NOTE — Letter (Signed)
Summary: [facility] No Show Letter  [facility] [place]  [address] [place], [place] [address]   Phone: [phone]  Fax: [phone]    05/26/2007   Dear Ronnie [name],   Our records indicate that you missed your scheduled appointment with _____DR PAZ________________ on ____4/8/09________.  Please contact this office to reschedule your appointment as soon as possible.  It is important that you keep your scheduled appointments with your physician, so we can provide you the best care possible.  Please be advised that there may be a charge for "no show" appointments.    Sincerely,   [facility]

## 2010-03-21 NOTE — Letter (Signed)
Summary: [facility]  [facility]   Imported By: Freddy Jaksch 02/12/2007 09:36:56  _____________________________________________________________________  External Attachment:    Type:   Image     Comment:   External Document

## 2010-03-21 NOTE — Assessment & Plan Note (Signed)
Summary: possible insect bite from 4 wks ago/alj  Medications Added PROTONIX 40 MG TBEC (PANTOPRAZOLE SODIUM) bid TOPROL XL 25 MG  TB24 (METOPROLOL SUCCINATE) 1 by mouth qd KETOCONAZOLE 2 %  CREA (KETOCONAZOLE) apply b.i.d. for 10 days      Allergies Added: NKDA  Vital Signs:  Patient Profile:   50 Years Old Male Weight:      217.4 pounds Temp:     98.7 degrees F oral BP sitting:   120 / 80  Vitals Entered By: Shary Decamp (November 05, 2006 9:27 AM)                 Chief Complaint:  insect bite rt forearm 1 mo ago.  History of Present Illness: insect bite? Has used over-the-counter creams and steroids without much help. occ. pruritus  recently had an EGD and they put out mouth protector during the procedure.  The next day he noticed pain and swelling on the lower lip  Current Allergies (reviewed today): No known allergies  Updated/Current Medications (including changes made in today's visit):  PROTONIX 40 MG TBEC (PANTOPRAZOLE SODIUM) bid TOPROL XL 25 MG  TB24 (METOPROLOL SUCCINATE) 1 by mouth qd KETOCONAZOLE 2 %  CREA (KETOCONAZOLE) apply b.i.d. for 10 days   Past Medical History:    Allergic rhinitis    GERD     Review of Systems       doing well it is not taking any medication that would account for lip swelling allergies well controlled with Zyrtec   Physical Exam  General:     alert, well-developed, and well-nourished.   Mouth:     lower lip looks indeed slightly swollen in appearance not nontender to palpation. The lower anterior gum is slightly red without discharge or ulcers Skin:     update her right forearm he has a 1 cm lesion:  ring like-shape,macular, slightly red.    Impression & Recommendations:  Problem # 1:  DERMATITIS (ICD-692.9) fungal? trial w/ nizoral  Problem # 2:  gum excoriation likely from trauma during the EGD. Is already getting better. Observation  Problem # 3:  GERD (ICD-530.81) status post EGD  thoracolumbar its esophagus. Biopsies pending.  His updated medication list for this problem includes:    Protonix 40 Mg Tbec (Pantoprazole sodium) ..... Bid   Problem # 4:  last time seen was in 2006 recommend complete physical  Complete Medication List: 1)  Protonix 40 Mg Tbec (Pantoprazole sodium) .... Bid 2)  Toprol Xl 25 Mg Tb24 (Metoprolol succinate) .Marland Kitchen.. 1 by mouth qd 3)  Ketoconazole 2 % Crea (Ketoconazole) .... Apply b.i.d. for 10 days     Prescriptions: KETOCONAZOLE 2 %  CREA (KETOCONAZOLE) apply b.i.d. for 10 days  #1 x 0   Entered and Authorized by:   Nolon Rod. Paz MD   Signed by:   Nolon Rod. Paz MD on 11/05/2006   Method used:   Print then Give to Patient   RxID:   [id number]  ]   Tetanus/Td Immunization History:    Tetanus/Td # 1:  Historical (03/23/2003)

## 2010-03-21 NOTE — Assessment & Plan Note (Signed)
Summary: CONGESTION/SINUS DRAINAGE/[facility]DC   Vital Signs:  Patient profile:   50 year old male Weight:      204.2 pounds BMI:     25.62 Temp:     98.3 degrees F oral Pulse rate:   72 / minute Resp:     16 per minute BP sitting:   110 / 68  (left arm) Cuff size:   large  Vitals Entered By: Shonna Chock (September 05, 2008 1:36 PM) CC: CONGESTION SINCE LAST THURSDAY Comments REVIEWED MED LIST, PATIENT AGREED DOSE AND INSTRUCTION CORRECT    CC:  CONGESTION SINCE LAST THURSDAY.  History of Present Illness: Onset as head congestion , ST , PNDrainage & fever 08/31/2008 ; increased symptoms last night with chest symptoms.Rx: Mucinex, Tylenol  Allergies: 1)  ! * Wheat 2)  ! * Shellfish 3)  ! * Peanuts 4)  ! * Eggs  Review of Systems General:  Complains of fever and sweats; denies chills. ENT:  Complains of earache, nasal congestion, and sinus pressure; no frontal headache; scant yellow from head. Facial pain.Marland Kitchen Resp:  Complains of cough, shortness of breath, and sputum productive; denies wheezing; scant sputum, clear. Minor SOB last night; non smoker; no PMH of asthma.  Physical Exam  General:  in no acute distress; alert,appropriate and cooperative throughout examination Ears:  External ear exam shows no significant lesions or deformities.  Otoscopic examination reveals  wax bilaterally. Hearing is grossly normal bilaterally. Nose:  External nasal examination shows no deformity or inflammation. Nasal mucosa are dry  without lesions or exudates. Mouth:  Oral mucosa and oropharynx without lesions or exudates.  S/P uvulectomy Lungs:  Normal respiratory effort, chest expands symmetrically. Lungs are clear to auscultation, no crackles or wheezes. Cervical Nodes:  No lymphadenopathy noted Axillary Nodes:  No palpable lymphadenopathy   Impression & Recommendations:  Problem # 1:  BRONCHITIS-ACUTE (ICD-466.0)  His updated medication list for this problem includes:    Proair Hfa 108 (90 Base)  Mcg/act Aers (Albuterol sulfate) .Marland Kitchen... 1-2 puffs q4-6h prn    Clarithromycin 500 Mg Tabs (Clarithromycin) .Marland Kitchen... 2 once daily with food  Problem # 2:  URI (ICD-465.9)  Complete Medication List: 1)  Proair Hfa 108 (90 Base) Mcg/act Aers (Albuterol sulfate) .Marland Kitchen.. 1-2 puffs q4-6h prn 2)  Mvi  3)  Nexium 40 Mg Cpdr (Esomeprazole magnesium) .Marland Kitchen.. 1 before breakfast every day 4)  Cpap  .... Setting: 10cm h2o w/humidifier-mask-choice of supplies dx 780.57 5)  Toprol Xl 25 Mg Xr24h-tab (Metoprolol succinate) .Marland Kitchen.. 1 by mouth once daily 6)  Clarithromycin 500 Mg Tabs (Clarithromycin) .... 2 once daily with food  Patient Instructions: 1)  Neti rinse once daily until congestion gone. 2)  Drink as much fluid as you can tolerate for the next few days. Prescriptions: CLARITHROMYCIN 500 MG TABS (CLARITHROMYCIN) 2 once daily with food  #20 x 0   Entered and Authorized by:   Marga Melnick MD   Signed by:   Marga Melnick MD on 09/05/2008   Method used:   Print then Give to Patient   RxID:   [id number]

## 2010-03-21 NOTE — Consult Note (Signed)
[facility])   Imported By: Esmeralda Links D'jimraou 09/08/2007 12:08:41  _____________________________________________________________________  External Attachment:    Type:   Image     Comment:   External Document

## 2010-03-21 NOTE — Letter (Signed)
Summary: [facility]ER[facility]HMA [facility]  [facility]HMA [facility]US [facility]   Imported By: Freddy Jaksch 12/30/2006 10:56:08  _____________________________________________________________________  External Attachment:    Type:   Image     Comment:   INTERNAL

## 2010-03-21 NOTE — Assessment & Plan Note (Signed)
Summary: STOMACH PROBLEMS//KN   Vital Signs:  Patient profile:   50 year old male Weight:      215.50 pounds Temp:     98.1 degrees F oral Pulse rate:   76 / minute Pulse rhythm:   regular BP sitting:   116 / 84  (left arm) Cuff size:   large  Vitals Entered By: Army Fossa CMA (August 15, 2009 4:14 PM) CC: Pt here c/o diarrhea x 2 days, pressure around his eyes Comments Tried Pepto no relief.    History of Present Illness: sinus congestion 4 days ago exposed to strep 3 days ago no ST developed watery diarrjea several times a day 3 days ago: no bloody, usually after eat. took peptobismol yesterday, slightly  better today  ROS subjective fever last night?  no cough no N-V- mild epigastric pain (can't describe if coliky or not )  Past History:  Past Medical History: Reviewed history from 03/28/2009 and no changes required. Allergic rhinitis GERD OSA --on  CPAP DVT RLE 2002 aprox, (-) anticoag panel palpitations (on betablokers) Asthma Anxiety  Past Surgical History: Reviewed history from 02/26/2007 and no changes required. Tonsillectomy septoplasty, turbinate reduction, UPPP  Social History: Reviewed history from 03/28/2009 and no changes required. Married 1 child tobacco--never  ETOH-- rarely  exercise--very active at work, goes to [employer] x 4/week (gaining wt since)  Review of Systems      See HPI  Physical Exam  General:  alert, well-developed, and well-nourished.   Mouth:  no redness or discharge Lungs:  normal respiratory effort, no intercostal retractions, no accessory muscle use, and normal breath sounds.   Heart:  normal rate, regular rhythm, no murmur, and no gallop.   Abdomen:  soft, non-tender, no distention, no masses, no guarding, and no rigidity.   Extremities:  no pretibial edema bilaterally    Impression & Recommendations:  Problem # 1:  DIARRHEA (ICD-787.91) acute diarrhea, slightly better today Likely viral He also had sinus  congestion, exam is negative. pt is concerned about exposure to strep but he does not have any redness or sore throat at this time Plan: Rest, fluids, Pepto-Bismol Call if better in a few days call if fever or sore throat  Complete Medication List: 1)  Mvi  2)  Nexium 40 Mg Cpdr (Esomeprazole magnesium) .Marland Kitchen.. 1 before breakfast every day 3)  Cpap  .... Setting: 10cm h2o w/humidifier-mask-choice of supplies dx 780.57 4)  Toprol Xl 25 Mg Xr24h-tab (Metoprolol succinate) .Marland Kitchen.. 1 by mouth once daily

## 2010-03-21 NOTE — Letter (Signed)
Summary: Work Dietitian at [employer]  [address] [place], [place] [address]   Phone: [phone]  Fax: [phone]    Today's Date: September 05, 2008  Name of Patient: [name]  The above named patient had a medical visit today at:  am / 1:30pm.  Please take this into consideration when reviewing the time away from work/school.    Special Instructions:  Arly.Keller  ] None  [  ] To be off the remainder of today, returning to the normal work / school schedule tomorrow.  [  ] To be off until the next scheduled appointment on ______________________.  [  ] Other ________________________________________________________________ ________________________________________________________________________   Sincerely yours,   Shonna Chock

## 2010-03-21 NOTE — Letter (Signed)
Summary: [facility]  [facility]   Imported By: Lanelle Bal 05/19/2009 10:59:47  _____________________________________________________________________  External Attachment:    Type:   Image     Comment:   External Document

## 2010-03-21 NOTE — Letter (Signed)
Summary: [facility]  External Correspondence--[facility]   Imported By: Freddy Jaksch 08/26/2006 12:23:02  _____________________________________________________________________  External Attachment:    Type:   Image     Comment:   [facility]

## 2010-03-21 NOTE — Assessment & Plan Note (Signed)
Summary: acute only - puffy at iv site.cbs   Vital Signs:  Patient Profile:   50 Years Old Male Height:     75 inches Weight:      198.6 pounds Temp:     97.8 degrees F BP sitting:   100 / 64  Vitals Entered By: Shary Decamp (January 07, 2008 10:49 AM)                 Chief Complaint:  pt had IV 1 month ago -- puffy - sore @ site.  History of Present Illness: pt had IV 1 month ago -- area is  puffy , denies actual pain    Updated Prior Medication List: ATENOLOL 25 MG  TABS (ATENOLOL) 1 by mouth qd PROAIR HFA 108 (90 BASE) MCG/ACT  AERS (ALBUTEROL SULFATE) 1-2 puffs q4-6h prn * MVI   Current Allergies (reviewed today): ! * WHEAT ! * SHELLFISH ! * PEANUTS ! * EGGS  Past Medical History:    Reviewed history from 02/26/2007 and no changes required:       Allergic rhinitis       GERD       OSA        DVT RLE, (-) anticoag panel       palpitations (on betablokers)       Asthma       Anxiety     Review of Systems       denies fever (-) redness or warmness at L arm   Physical Exam  General:     alert and well-developed.   Extremities:     R arm normal L arm: area of concern is the flexion surface of the elbow , medially: no red, no warm, compared to R side is indeed a little puffy w/o mass  palpation of veins: normal    Impression & Recommendations:  Problem # 1:  LOCALIZED SUPERFICIAL SWELLING MASS OR LUMP (ICD-782.2) Assessment: New scar tissue? rec: observe if area increase in size, gets harder or different: to let me know   Complete Medication List: 1)  Atenolol 25 Mg Tabs (Atenolol) .Marland Kitchen.. 1 by mouth qd 2)  Proair Hfa 108 (90 Base) Mcg/act Aers (Albuterol sulfate) .Marland Kitchen.. 1-2 puffs q4-6h prn 3)  Marvin.Bar     ]

## 2010-03-21 NOTE — Letter (Signed)
Summary: [facility]   Imported By: Freddy Jaksch 03/10/2007 10:26:39  _____________________________________________________________________  External Attachment:    Type:   Image     Comment:   External Document

## 2010-03-21 NOTE — Progress Notes (Signed)
Summary: NEEDS PRESCRIPTION FOR NEW CPAP MACHINE   Phone Note Call from Patient Call back at [phone]   Caller: Patient Summary of Call: PATIENT NEEDS NEW CPAP MACHINE  PER [facility]--NEEDS PRESCRIPTION FOR RESMED CPAP MACHINE----SET AT 10CM H2O WITH HUMIDIFIER AND MASK AND SUPPLIES OF CHOICE    PLEASE FAX PRESCRIPTION TO [phone],  PLEASE CALL PATIENT TO ADVISE THAT FAX HAS BEEN SENT  PATIENT SAYS IT WOULD BE GREAT IF THIS COULD BE DONE TODAY SO HE COULD PICK UP HIS MACHINE TODAY Initial call taken by: Jerolyn Shin,  April 27, 2008 12:16 PM  Follow-up for Phone Call        ok to do, also tell patient needs to see Dr Shelle Iron (or his OSA doctor) from time to time Runnemede E. Paz MD  April 27, 2008 1:11 PM   rx faxed; lmom to make appt with dr clance .Shary Decamp  April 27, 2008 1:39 PM     New/Updated Medications: * CPAP setting: 10cm H2o w/humidifier-mask-choice of supplies dx 780.57   Prescriptions: CPAP setting: 10cm H2o w/humidifier-mask-choice of supplies dx 780.57  #1 x 0   Entered by:   Shary Decamp   Authorized by:   Nolon Rod. Paz MD   Signed by:   Shary Decamp on 04/27/2008   Method used:   Print then Give to Patient   RxID:   [id number]

## 2010-03-21 NOTE — Letter (Signed)
Summary: External Correspondence/MEDOFF-OFFICE VISIT  External Correspondence/MEDOFF-OFFICE VISIT   Imported By: Freddy Jaksch 11/05/2006 10:00:02  _____________________________________________________________________  External Attachment:    Type:   Image     Comment:   External Document

## 2010-04-05 ENCOUNTER — Other Ambulatory Visit: Payer: Self-pay | Admitting: Internal Medicine

## 2010-04-05 ENCOUNTER — Encounter: Payer: Self-pay | Admitting: Internal Medicine

## 2010-04-05 ENCOUNTER — Encounter (INDEPENDENT_AMBULATORY_CARE_PROVIDER_SITE_OTHER): Payer: BC Managed Care – PPO | Admitting: Internal Medicine

## 2010-04-05 DIAGNOSIS — Z0000 Encounter for general adult medical examination without abnormal findings: Secondary | ICD-10-CM

## 2010-04-05 LAB — LIPID PANEL
Cholesterol: 158 mg/dL (ref 0–200)
HDL: 43.9 mg/dL (ref >39.00)
LDL Cholesterol: 93 mg/dL (ref 0–99)
Total CHOL/HDL Ratio: 4
Triglycerides: 106 mg/dL (ref 0.0–149.0)
VLDL: 21.2 mg/dL (ref 0.0–40.0)

## 2010-04-05 LAB — BASIC METABOLIC PANEL
BUN: 11 mg/dL (ref 6–23)
CO2: 27 mEq/L (ref 19–32)
Calcium: 9.2 mg/dL (ref 8.4–10.5)
Chloride: 107 mEq/L (ref 96–112)
Creatinine, Ser: 1.1 mg/dL (ref 0.4–1.5)
GFR: 97.26 mL/min (ref >60.00)
Glucose, Bld: 85 mg/dL (ref 70–99)
Potassium: 4.3 mEq/L (ref 3.5–5.1)
Sodium: 141 mEq/L (ref 135–145)

## 2010-04-05 LAB — AST: AST: 34 U/L (ref 0–37)

## 2010-04-05 LAB — ALT: ALT: 33 U/L (ref 0–53)

## 2010-04-06 LAB — CONVERTED CEMR LAB
Basophils Absolute: 0 10*3/uL (ref 0.0–0.1)
Basophils Relative: 1 % (ref 0–1)
Eosinophils Absolute: 0 10*3/uL (ref 0.0–0.7)
Eosinophils Relative: 1 % (ref 0–5)
HCT: 42.5 % (ref 39.0–52.0)
Hemoglobin: 13.7 g/dL (ref 13.0–17.0)
Lymphocytes Relative: 37 % (ref 12–46)
Lymphs Abs: 1.2 10*3/uL (ref 0.7–4.0)
MCHC: 32.2 g/dL (ref 30.0–36.0)
MCV: 91.6 fL (ref 78.0–100.0)
Monocytes Absolute: 0.4 10*3/uL (ref 0.1–1.0)
Monocytes Relative: 13 % — ABNORMAL HIGH (ref 3–12)
Neutro Abs: 1.6 10*3/uL — ABNORMAL LOW (ref 1.7–7.7)
Neutrophils Relative %: 49 % (ref 43–77)
Platelets: 180 10*3/uL (ref 150–400)
RBC: 4.64 M/uL (ref 4.22–5.81)
RDW: 12.8 % (ref 11.5–15.5)
WBC: 3.3 10*3/uL — ABNORMAL LOW (ref 4.0–10.5)

## 2010-04-10 NOTE — Assessment & Plan Note (Signed)
Summary: CPX/CBS/PH   Vital Signs:  Patient profile:   50 year old male Height:      75 inches Weight:      220.25 pounds BMI:     27.63 Pulse rate:   70 / minute Pulse rhythm:   regular BP sitting:   118 / 76  (left arm) Cuff size:   large  Vitals Entered By: Army Fossa CMA (April 05, 2010 9:33 AM) CC: CPX, fsating  Comments no concerns [facility] [address]    History of Present Illness: CPX  Preventive Screening-Counseling & Management  Caffeine-Diet-Exercise     Times/week: 2  Current Medications (verified): 1)  Mvi 2)  Nexium 40 Mg Cpdr (Esomeprazole Magnesium) .Marland Kitchen.. 1 Before Breakfast Every Day 3)  Cpap .... Setting: 10cm H2o W/humidifier-Mask-Choice of Supplies Dx 780.57 4)  Toprol Xl 25 Mg Xr24h-Tab (Metoprolol Succinate) .Marland Kitchen.. 1 By Mouth Once Daily  Allergies (verified): No Known Drug Allergies  Past History:  Past Medical History: Reviewed history from 03/28/2009 and no changes required. Allergic rhinitis GERD OSA --on  CPAP DVT RLE 2002 aprox, (-) anticoag panel palpitations (on betablokers) Asthma Anxiety  Past Surgical History: Reviewed history from 02/26/2007 and no changes required. Tonsillectomy septoplasty, turbinate reduction, UPPP  Family History: DM--no MI--no stroke-- no colon ca ca--no prostate ca-- father  Social History: Married 1 child tobacco--never  ETOH-- rarely  exercise--very active at work, goes to [employer] rarely (1/week?) diet-- trying to avoid fast food , trying portion control   Review of Systems General:  Denies chills, fatigue, and fever. CV:  Denies chest pain or discomfort, palpitations, and swelling of feet. Resp:  Denies cough, shortness of breath, and wheezing. GI:  Denies bloody stools, diarrhea, nausea, and vomiting. Psych:  Denies anxiety and depression.  Physical Exam  General:  alert, well-developed, and well-nourished.   Neck:  no masses and normal carotid upstroke.   Lungs:  normal  respiratory effort, no intercostal retractions, no accessory muscle use, and normal breath sounds.   Heart:  normal rate, regular rhythm, no murmur, and no gallop.   Abdomen:  soft, non-tender, no distention, no masses, no guarding, and no rigidity.   Extremities:  no pretibial edema bilaterally  Psych:  Cognition and judgment appear intact. Alert and cooperative with normal attention span and concentration.  not anxious appearing and not depressed appearing.     Impression & Recommendations:  Problem # 1:  HEALTH SCREENING (ICD-V70.0)  Td 2005 did not take a flu shot .Marland Kitchen.."i don't do flu shots, got sick twice"   EKG-- sinus brady  Cscope 10-08 per pt (2 polyps, next 5 years) and EGD 2008  (Dr Candelaria Stagers)  PSAs per Urology  , used to have PSA q 6 months, last PSA july 2011, was told to check once a year from now on (per patient)  plans to exercises more and go  to the gym; plans to eat better  discussed a  healthy diet  Orders: Venipuncture ([address]) TLB-BMP (Basic Metabolic Panel-BMET) (80048-METABOL) TLB-Lipid Panel (80061-LIPID) TLB-ALT (SGPT) (84460-ALT) TLB-AST (SGOT) (84450-SGOT) EKG w/ Interpretation (93000) Specimen Handling (60454)  Complete Medication List: 1)  Toprol Xl 25 Mg Xr24h-tab (Metoprolol succinate) .Marland Kitchen.. 1 by mouth once daily 2)  Nexium 40 Mg Cpdr (Esomeprazole magnesium) .Marland Kitchen.. 1 before breakfast every day 3)  Cpap  .... Setting: 10cm h2o w/humidifier-mask-choice of supplies dx 780.57 4)  Mvi  `  Patient Instructions: 1)  Please schedule a follow-up appointment in 1 year.  Orders Added: 1)  Venipuncture [36415] 2)  TLB-BMP (Basic Metabolic Panel-BMET) [80048-METABOL] 3)  TLB-Lipid Panel [80061-LIPID] 4)  TLB-ALT (SGPT) [84460-ALT] 5)  TLB-AST (SGOT) [84450-SGOT] 6)  EKG w/ Interpretation [93000] 7)  Specimen Handling [99000] 8)  Est. Patient age 76-64 [18]     Risk Factors:  Exercise:  yes    Times per week:  2

## 2010-07-02 NOTE — Assessment & Plan Note (Signed)
 HEALTHCARE                            CARDIOLOGY OFFICE NOTE   NAME:[name], Ronnie BECKFORD                       MRN:          [id number]  DATE:02/26/2007                            DOB:          11/24/1960    I was asked as the office physician of the day to review some  electrocardiograms faxed over from Dr. Leta Jungling office.  I was able to get  to these at the very end of the day and was unfortunately not able to  speak with Dr. Drue Novel, placing a call to their office at 6:00 p.m.  I was  also not able to contact him by pager.  I have no clinical information  on this particular patient other than records that I was able to pull up  indicating a history of asthma with multiple food allergies, obstructive  sleep apnea, right lower extremity deep venous thrombosis and  gastroesophageal reflux disease.  I see no description of cardiovascular  disease or arrhythmia.  I have three tracings, the first being from  February 2005.  This particular tracing shows sinus rhythm at 61 beats  per minute with normal intervals.  The PR interval was 142 milliseconds  at that time.  Two subsequent tracings are dated January 9, today's  date.  Both are listed occurring at 10:32 this morning.  They do appear  to be separate tracings with differing motion artifact, however.  One  tracing with a heart rate of 67 beats per minute shows nonspecific ST-T  wave changes with motion artifact.  The PR interval is borderline short  at 130 milliseconds, although there is no definitive evidence of pre-  excitation.  R wave progression anteriorly is somewhat decreased  compared to the tracing from 2005, although there are no frank anterior  Q waves.  QT interval is also within normal limits.  The other tracing  from January 9 with heart rate of 65 beats per minute shows similar  changes with differing motion artifact and a similar PR interval of 130  milliseconds with normal QT interval.  Again,  nonspecific ST-T wave  changes are noted.     Jonelle Sidle, MD  Electronically Signed    SGM/MedQ  DD: 02/26/2007  DT: 02/27/2007  Job #: [id number]   cc:   Willow Ora, MD

## 2010-07-05 NOTE — Op Note (Signed)
NAMESHANTE, MAYSONET NO.:  [id number]   MEDICAL RECORD NO.:  [id number]          PATIENT TYPE:  OIB   LOCATION:  2550                         FACILITY:  [facility]   PHYSICIAN:  Lucky Cowboy, MD         DATE OF BIRTH:  10-04-1960   DATE OF PROCEDURE:  07/16/2005  DATE OF DISCHARGE:                                 OPERATIVE REPORT   PREOPERATIVE DIAGNOSES:  1.  Obstructive sleep apnea.  2.  Left septal deviation.  3.  Bilateral inferior turbinate hypertrophy.  4.  Tonsillar hypertrophy.  5.  Elongated soft palate.   POSTOPERATIVE DIAGNOSES:  1.  Obstructive sleep apnea.  2.  Left septal deviation.  3.  Bilateral inferior turbinate hypertrophy.  4.  Tonsillar hypertrophy.  5.  Elongated soft palate.   PROCEDURES:  1.  Septoplasty.  2.  Bilateral inferior turbinate reductions.  3.  Uvulopalatopharyngoplasty.  4.  Tonsillectomy.   SURGEON:  Lucky Cowboy, MD   ANESTHESIA:  General.   ESTIMATED BLOOD LOSS:  20 mL.   SPECIMENS:  None.   COMPLICATIONS:  None.   INDICATIONS:  This patient is a 50 year old male who suffers from severe  obstructive sleep apnea.  He has difficulty with using the CPAP machine and  is having difficulty with prolonged use of the machine.  He cannot breathe  through his nose.  He was noted to have a left septal deviation with  obstructing inferior turbinate hypertrophy.  Further, he is noted to have 3-  4+ bilateral palatine tonsils with a very narrowed oropharyngeal inlet and  an elongated soft palate.  He is felt to be an excellent surgical candidate  for obstructive sleep apnea surgery.   FINDINGS:  The patient was noted to have a left septal deviation with both  bony and soft tissue inferior turbinate hypertrophy.  There were markedly  enlarged tonsils and a very narrowed oropharyngeal inlet with an elongated  uvula and soft palate.   PROCEDURE:  The patient was taken to the operating room and placed on the  table in the  supine position.  He was then placed under general endotracheal  anesthesia and the table rotated clockwise 45 degrees.  The nasal septum was  then injected with 1% lidocaine with 1:100,000 of epinephrine.  The nose was  prepped with Betadine and draped in the usual sterile fashion.  After  allowing time for vasoconstrictive effect, also with the use of the Afrin on  cottonoid pledgets, a left hemitransfixion incision was made using a #15  blade and submucoperichondrial and mucoperiosteal flaps elevated.  The bony-  cartilaginous junction was divided and contralateral flaps elevated  posteriorly.  The posterior portion of septum was taken down high in the  nasal cavity using the open Jansen-Middleton forceps.  The inferior portion  was taken down using a duckbill forceps.  The bony crest was taken down  using a 4-mm osteotome.  This allowed correction of the septal deviation.  The left hemitransfixion incision was closed in a simple interrupted fashion  using 4-0 chromic.  A horizontal mattress  stitch was applied using 4-0 plain  gut.  Attention was then turned to turbinate reduction.  They had been  previously injected with the same local anesthetic.  The inferior 1/2 of  both of the inferior turbinates was then removed along the mucosal portion  with the microdebrider.  The exposed bone was taken down using a through-cup  forceps and suction cautery performed, bilaterally.  At the end of the case,  each of the nasal cavities was packed with Bactroban cream-coated Telfa  pledgets.  They were tied to one another anterior to the columella.   The tonsillectomy and palate surgery was next performed.  The table was  rotated counterclockwise 90 degrees.  The neck was gently extended.  A Crowe-  Davis mouth gag with a #4 tongue blade was then placed intraorally, opened  and suspended on the Mayo stand.  The soft palate was palpated.  The right  palatine tonsil was grasped with Allis clamps and  directed inferomedially.  The Harmonic scalpel was then used to excise the tonsil staying within the  peritonsillar space adjacent to the tonsillar capsule.  The left palatine  tonsil was removed in an identical fashion.  The inferior portion of the  palate was palpated.  The levator dimple was identified.  The uvula and  inferior palate was then removed using the Harmonic scalpel.  The anterior  and posterior tonsillar pillars as well as the cut edges of the midline soft  palate were then reapproximated in a simple interrupted fashion using 4-0  Vicryl.  An NG tube was placed down the esophagus for suctioning of the  gastric contents.  The mouth gag was removed noting no damage to the teeth  or soft tissues.  The table was rotated clockwise 45 degrees to its original  position.  The patient was awakened from anesthesia and taken to the Post  Anesthesia Care Unit in stable condition.  There were no complications.      Lucky Cowboy, MD  Electronically Signed     SJ/MEDQ  D:  07/16/2005  T:  07/16/2005  Job:  [id number]

## 2010-08-13 ENCOUNTER — Encounter: Payer: Self-pay | Admitting: Internal Medicine

## 2010-08-13 ENCOUNTER — Ambulatory Visit (INDEPENDENT_AMBULATORY_CARE_PROVIDER_SITE_OTHER): Payer: BC Managed Care – PPO | Admitting: Internal Medicine

## 2010-08-13 VITALS — BP 118/82 | HR 72 | Temp 98.6°F | Wt 223.6 lb

## 2010-08-13 DIAGNOSIS — J329 Chronic sinusitis, unspecified: Secondary | ICD-10-CM

## 2010-08-13 MED ORDER — AMOXICILLIN 500 MG PO CAPS: 1000.0000 mg | ORAL_CAPSULE | Freq: Two times a day (BID) | ORAL | Status: AC

## 2010-08-13 NOTE — Patient Instructions (Addendum)
Rest, fluids , tylenol For cough, take Mucinex  twice a day as needed for congestion claritin 10mg   OTC once daily x 10 days Qnsal sample: 2 sprays on each side of the nose daily, call for a prescription if needed  If no better in 3 days , start taking the antibiotic Amoxicillin Call if no better in few days Call anytime if the symptoms are severe, you have high fever, short of breath, persistent headache

## 2010-08-13 NOTE — Progress Notes (Signed)
  Subjective:    Patient ID: Ronnie [name], male    DOB: 1960-03-07, 50 y.o.   MRN: [id number]  HPI 4 days history of sinus congestion, frontal headaches, occasional dizziness. Has been blowing some yellow discharge from his nose.  Past Medical History  Diagnosis Date  . Allergic rhinitis   . GERD (gastroesophageal reflux disease)   . OSA on CPAP   . DVT (deep venous thrombosis)     RLE 2002 aprox, (-) anticoag panel  . Palpitations     on betablokers  . Asthma   . Anxiety     Past Surgical History  Procedure Date  . Tonsillectomy   . Nasal septoplasty w/ turbinoplasty      Review of Systems No fever. Some sneezing, no itchy eyes or nose, occasional watery discharge in the last few days. No sore throat Mild chest congestion but no cough or wheezing. Mild nausea yesterday, no vomiting    Objective:   Physical Exam  Constitutional: He appears well-developed and well-nourished. No distress.  HENT:  Head: Normocephalic and atraumatic.       NTTP at maxillary or frontal sinuses   Cardiovascular: Normal rate, regular rhythm and normal heart sounds.   No murmur heard. Pulmonary/Chest: Effort normal and breath sounds normal. No respiratory distress. He has no wheezes. He has no rales.  Musculoskeletal: He exhibits no edema.  Skin: He is not diaphoretic.          Assessment & Plan:

## 2010-08-13 NOTE — Assessment & Plan Note (Signed)
Sx c/w sinusitis---. Bacterial v. Viral v. Allergic See instructions

## 2010-09-16 ENCOUNTER — Other Ambulatory Visit: Payer: Self-pay | Admitting: Internal Medicine

## 2010-10-30 ENCOUNTER — Ambulatory Visit (INDEPENDENT_AMBULATORY_CARE_PROVIDER_SITE_OTHER): Payer: BC Managed Care – PPO | Admitting: Internal Medicine

## 2010-10-30 ENCOUNTER — Encounter: Payer: Self-pay | Admitting: Internal Medicine

## 2010-10-30 DIAGNOSIS — R509 Fever, unspecified: Secondary | ICD-10-CM

## 2010-10-30 DIAGNOSIS — J069 Acute upper respiratory infection, unspecified: Secondary | ICD-10-CM

## 2010-10-30 MED ORDER — AMOXICILLIN 500 MG PO CAPS: 500.0000 mg | ORAL_CAPSULE | Freq: Three times a day (TID) | ORAL | Status: AC

## 2010-10-30 NOTE — Progress Notes (Signed)
  Subjective:    Patient ID: Ronnie [name], male    DOB: Nov 02, 1960, 50 y.o.   MRN: [id number]  HPI Respiratory tract infection Onset/symptoms:this am @ 4 am  as sneezing , body aching,pressure around eyes w/o discharge Exposures (illness/environmental/extrinsic):no Progression of symptoms:temp by mid day Treatments/response:NSAIDS, Robitussin, nasal rinse, nasal steroid Present symptoms: Chills/sweats:slight Frontal headache:no Facial pain:no Nasal purulence:no Sore throat:no Dental pain:no Lymphadenopathy:no Wheezing/shortness of breath:no Cough/sputum:dry cough Past medical history: Seasonal allergies:yes/asthma:no Smoking history:never      Review of Systems     Objective:   Physical Exam General appearance is of good health and nourishment; no acute distress or increased work of breathing is present.  No  lymphadenopathy about the head, neck, or axilla noted.   Eyes: No conjunctival inflammation or lid edema is present. Extraocular motion is intact. Ptosis is present on the left(present for at least 10 years by history). Vision is normal.  Ears:  External ear exam shows no significant lesions or deformities.  Otoscopic examination reveals wax  bilaterally  Nose:  External nasal examination shows no deformity or inflammation. Nasal mucosa are pink and moist without lesions or exudates. No septal dislocation or dislocation.No obstruction to airflow.   Oral exam: Dental hygiene is good; lips and gums are healthy appearing.There is no oropharyngeal erythema or exudate noted. S/P uvulectomy  Neck:  No deformities, thyromegaly, masses, or tenderness noted.   Supple with full range of motion without pain.   Heart:  Normal rate and regular rhythm. S1 and S2 normal without gallop, murmur, click, rub or other extra sounds.   Lungs:Chest clear to auscultation; no wheezes, rhonchi,rales ,or rubs present.No increased work of breathing.    Extremities:  No cyanosis, edema, or  clubbing  noted    Skin: Warm & dry        Assessment & Plan:   #1 acute upper respiratory tract infection with fever and pressure behind his eyes. Clinically there is no evidence of infectious conjunctivitis.  Plan: See orders

## 2010-10-30 NOTE — Patient Instructions (Addendum)
NSAIDS ( Aleve, Advil, Naproxen) or Tylenol every 4 hrs as needed for fever as discussed based on label recommendations  Plain Mucinex for thick secretions ;force NON dairy fluids for next 48 hrs. Use a Neti pot daily as needed for sinus congestion  Remain out work 9/12 & 9/13 if fever persists

## 2010-11-01 ENCOUNTER — Telehealth: Payer: Self-pay

## 2010-11-01 NOTE — Telephone Encounter (Signed)
Plan: Continue with Tylenol, ibuprofen, Mucinex and amoxicillin. Drink plenty of fluids and rest Symptoms may last 2 or 3 more days however if symptoms are severe, he has a fever (more than 103), severe headache, rash, difficulty breathing: go to the ER.

## 2010-11-01 NOTE — Telephone Encounter (Signed)
Spoke with patient gave all Dr.Paz's recommendations, patient requested work note

## 2010-11-01 NOTE — Telephone Encounter (Signed)
Patient called Triage line, patient was seen on 10/30/10. Patient still with fever, taking ibuprofen ever 4 hours. I recommended patient alternate Tylenol/Motrin every 4-6 hours and use cool compresses on neck/head area (alternating as often as he see's fit). Patient would like to know if the Dr.Hopper has additional recommendations, patient was informed Dr.Hopper is out of the office and I will forward to another Dr in office

## 2010-11-18 LAB — POCT I-STAT, CHEM 8
BUN: 14
Calcium, Ion: 1.25
Chloride: 103
Creatinine, Ser: 1.2
Glucose, Bld: 104 — ABNORMAL HIGH
HCT: 44
Hemoglobin: 15
Potassium: 3.8
Sodium: 140
TCO2: 26

## 2010-11-22 LAB — I-STAT 8, (EC8 V) (CONVERTED LAB)
Acid-Base Excess: 2
BUN: 13
Bicarbonate: 28.1 — ABNORMAL HIGH
Chloride: 106
Glucose, Bld: 86
HCT: 46
Hemoglobin: 15.6
Operator id: 294341
Potassium: 3.9
Sodium: 141
TCO2: 29
pCO2, Ven: 47.8
pH, Ven: 7.377 — ABNORMAL HIGH

## 2010-11-22 LAB — POCT I-STAT CREATININE
Creatinine, Ser: 1.1
Operator id: 294341

## 2010-11-22 LAB — POCT CARDIAC MARKERS
CKMB, poc: 1.3
Myoglobin, poc: 115
Operator id: 294341
Troponin i, poc: <0.05

## 2010-11-22 LAB — DIFFERENTIAL
Basophils Absolute: 0
Basophils Relative: 0
Eosinophils Absolute: 0 — ABNORMAL LOW
Eosinophils Relative: 0
Lymphocytes Relative: 21
Lymphs Abs: 1.1
Monocytes Absolute: 0.7
Monocytes Relative: 13 — ABNORMAL HIGH
Neutro Abs: 3.4
Neutrophils Relative %: 65

## 2010-11-22 LAB — CBC
HCT: 40.8
Hemoglobin: 13.3
MCHC: 32.6
MCV: 91.2
Platelets: 208
RBC: 4.48
RDW: 12.6
WBC: 5.2

## 2011-01-17 ENCOUNTER — Ambulatory Visit (INDEPENDENT_AMBULATORY_CARE_PROVIDER_SITE_OTHER): Payer: BC Managed Care – PPO | Admitting: Internal Medicine

## 2011-01-17 ENCOUNTER — Encounter: Payer: Self-pay | Admitting: Internal Medicine

## 2011-01-17 VITALS — BP 110/70 | HR 62 | Temp 98.7°F | Wt 221.4 lb

## 2011-01-17 DIAGNOSIS — M7130 Other bursal cyst, unspecified site: Secondary | ICD-10-CM

## 2011-01-17 NOTE — Progress Notes (Signed)
  Subjective:    Patient ID: Ronnie [name], male    DOB: 1960-09-24, 50 y.o.   MRN: [id number]  HPI A week ago felt a "knot" @ the  left wrist. Since then it has not increased in size. No injury, pain or discharge  Past Medical History  Diagnosis Date  . Allergic rhinitis   . GERD (gastroesophageal reflux disease)   . OSA on CPAP   . DVT (deep venous thrombosis)     RLE 2002 aprox, (-) anticoag panel  . Palpitations     on betablokers  . Asthma   . Anxiety      Review of Systems     Objective:   Physical Exam Alert, oriented x3 Left wrist has a three-quarter centimeter soft mass at the ulnar aspect.       Assessment & Plan:  Findings consistent with a synovial cyst. Recommend observation. Also declined flu shot.

## 2011-02-12 ENCOUNTER — Other Ambulatory Visit: Payer: Self-pay | Admitting: Internal Medicine

## 2011-02-12 NOTE — Telephone Encounter (Signed)
Done

## 2011-02-18 LAB — HM COLONOSCOPY: HM Colonoscopy: ABNORMAL — AB

## 2011-03-09 ENCOUNTER — Encounter (HOSPITAL_COMMUNITY): Payer: Self-pay | Admitting: *Deleted

## 2011-03-09 ENCOUNTER — Emergency Department (HOSPITAL_COMMUNITY)
Admission: EM | Admit: 2011-03-09 | Discharge: 2011-03-09 | Disposition: A | Discharge status: Home / Self Care | Payer: BC Managed Care – PPO | Attending: Emergency Medicine | Admitting: Emergency Medicine

## 2011-03-09 DIAGNOSIS — J45909 Unspecified asthma, uncomplicated: Secondary | ICD-10-CM | POA: Insufficient documentation

## 2011-03-09 DIAGNOSIS — L509 Urticaria, unspecified: Secondary | ICD-10-CM | POA: Insufficient documentation

## 2011-03-09 DIAGNOSIS — K219 Gastro-esophageal reflux disease without esophagitis: Secondary | ICD-10-CM | POA: Insufficient documentation

## 2011-03-09 DIAGNOSIS — G4733 Obstructive sleep apnea (adult) (pediatric): Secondary | ICD-10-CM | POA: Insufficient documentation

## 2011-03-09 DIAGNOSIS — T7840XA Allergy, unspecified, initial encounter: Secondary | ICD-10-CM

## 2011-03-09 DIAGNOSIS — Z86718 Personal history of other venous thrombosis and embolism: Secondary | ICD-10-CM | POA: Insufficient documentation

## 2011-03-09 MED ORDER — DIPHENHYDRAMINE HCL 50 MG/ML IJ SOLN
INTRAMUSCULAR | Status: AC
  Administered 2011-03-09: 50 mg via INTRAVENOUS
  Filled 2011-03-09: qty 1

## 2011-03-09 MED ORDER — FAMOTIDINE IN NACL 20-0.9 MG/50ML-% IV SOLN
INTRAVENOUS | Status: AC
  Administered 2011-03-09: 20 mg via INTRAVENOUS
  Filled 2011-03-09: qty 50

## 2011-03-09 MED ORDER — DIPHENHYDRAMINE HCL 50 MG/ML IJ SOLN
50.0000 mg | Freq: Once | INTRAMUSCULAR | Status: AC
  Administered 2011-03-09: 50 mg via INTRAVENOUS

## 2011-03-09 MED ORDER — METHYLPREDNISOLONE SODIUM SUCC 125 MG IJ SOLR
125.0000 mg | Freq: Once | INTRAMUSCULAR | Status: AC
  Administered 2011-03-09: 125 mg via INTRAVENOUS
  Filled 2011-03-09: qty 2

## 2011-03-09 MED ORDER — FAMOTIDINE IN NACL 20-0.9 MG/50ML-% IV SOLN
20.0000 mg | Freq: Once | INTRAVENOUS | Status: AC
  Administered 2011-03-09: 20 mg via INTRAVENOUS

## 2011-03-09 NOTE — ED Provider Notes (Signed)
History     CSN: [id number]  Arrival date & time 03/09/11  0001   First MD Initiated Contact with Patient 03/09/11 [phone]      Chief Complaint  Patient presents with  . Allergic Reaction    (Consider location/radiation/quality/duration/timing/severity/associated sxs/prior treatment) HPI Comments: Patient here with acute allergic reaction with hives and itching to chest arms and legs - has a history of allergies and sees an allergist - previous history of similar when he wore a new shirt and was allergic to the dye - denies any new productions, clothing, - denies chest pain or shortness of breath, tongue swelling, difficulty swallowing.  Patient is a 51 y.o. male presenting with allergic reaction. The history is provided by the patient. No language interpreter was used.  Allergic Reaction The primary symptoms are  rash and urticaria. The primary symptoms do not include wheezing, shortness of breath, cough, abdominal pain, nausea, vomiting, diarrhea, dizziness, palpitations, altered mental status or angioedema. The current episode started 3 to 5 hours ago. The problem has been gradually improving. This is a recurrent problem.  The rash is associated with itching.  Associated with: no new exposure. Significant symptoms also include itching. Significant symptoms that are not present include eye redness, flushing or rhinorrhea.    Past Medical History  Diagnosis Date  . Allergic rhinitis   . GERD (gastroesophageal reflux disease)   . OSA on CPAP   . DVT (deep venous thrombosis)     RLE 2002 aprox, (-) anticoag panel  . Palpitations     on betablokers  . Asthma   . Anxiety     Past Surgical History  Procedure Date  . Tonsillectomy   . Nasal septoplasty w/ turbinoplasty     Family History  Problem Relation Age of Onset  . Diabetes Neg Hx   . Heart attack Neg Hx   . Stroke Neg Hx   . Colon cancer Neg Hx   . Prostate cancer Father     History  Substance Use Topics  .  Smoking status: Never Smoker   . Smokeless tobacco: Not on file  . Alcohol Use: Yes     rare      Review of Systems  HENT: Negative for rhinorrhea.   Eyes: Negative for redness.  Respiratory: Negative for cough, shortness of breath and wheezing.   Cardiovascular: Negative for palpitations.  Gastrointestinal: Negative for nausea, vomiting, abdominal pain and diarrhea.  Skin: Positive for itching and rash. Negative for flushing.  Neurological: Negative for dizziness.  Psychiatric/Behavioral: Negative for altered mental status.  All other systems reviewed and are negative.    Allergies  Review of patient's allergies indicates no known allergies.  Home Medications   Current Outpatient Rx  Name Route Sig Dispense Refill  . METOPROLOL SUCCINATE ER 25 MG PO TB24 Oral Take 25 mg by mouth daily.      Marland Kitchen NEXIUM 40 MG PO CPDR  take 1 capsule by mouth every morning BEFORE BREAKFAST. 30 capsule 5    BP 131/85  Pulse 69  Temp(Src) 98.5 F (36.9 C) (Oral)  Resp 17  Ht 6\' 3"  (1.905 m)  Wt 215 lb (97.523 kg)  BMI 26.87 kg/m2  SpO2 100%  Physical Exam  Nursing note and vitals reviewed. Constitutional: He is oriented to person, place, and time. He appears well-developed and well-nourished. No distress.  HENT:  Head: Normocephalic and atraumatic.  Right Ear: External ear normal.  Left Ear: External ear normal.  Nose: Nose normal.  Mouth/Throat: Oropharynx is clear and moist. No oropharyngeal exudate.  Eyes: Pupils are equal, round, and reactive to light. No scleral icterus.  Neck: Normal range of motion. Neck supple.  Cardiovascular: Normal rate, regular rhythm and normal heart sounds.  Exam reveals no gallop and no friction rub.   No murmur heard. Pulmonary/Chest: Effort normal and breath sounds normal. He exhibits no tenderness.  Abdominal: Soft. Bowel sounds are normal. He exhibits no distension. There is no tenderness.  Musculoskeletal: Normal range of motion.    Lymphadenopathy:    He has no cervical adenopathy.  Neurological: He is alert and oriented to person, place, and time. No cranial nerve deficit.  Skin: Skin is warm and dry.       Urticarial rash noted to neck, anterior trunk, and back with whelps consistent with hives  Psychiatric: He has a normal mood and affect. His behavior is normal. Judgment and thought content normal.    ED Course  Procedures (including critical care time)  Labs Reviewed - No data to display No results found.   Allergic reaction    MDM  Patient with history of multiple allergies presents with acute allergic reaction to unknown substance - symptoms have improved with steroids, pepcid, and benadryl - no airway involvement without chest pain or shortnss of breath - feels comfortable to go home.       Izola Price [place], [place] 03/09/11 [phone]

## 2011-03-09 NOTE — ED Provider Notes (Signed)
Medical screening examination/treatment/procedure(s) were performed by non-physician practitioner and as supervising physician I was immediately available for consultation/collaboration.  Jeffrey P Caporossi, MD 03/09/11 0645 

## 2011-03-09 NOTE — ED Notes (Signed)
Redness to skin decreasing.  Pt states itching has decreased.

## 2011-03-09 NOTE — ED Notes (Signed)
Pt presents with hives over his chest, arms, and legs.  Pt is experiencing an allergic reaction to an unknown allergen.

## 2011-04-11 ENCOUNTER — Ambulatory Visit (INDEPENDENT_AMBULATORY_CARE_PROVIDER_SITE_OTHER): Payer: BC Managed Care – PPO | Admitting: Internal Medicine

## 2011-04-11 ENCOUNTER — Telehealth: Payer: Self-pay | Admitting: Internal Medicine

## 2011-04-11 DIAGNOSIS — Z0000 Encounter for general adult medical examination without abnormal findings: Secondary | ICD-10-CM | POA: Insufficient documentation

## 2011-04-11 LAB — CBC WITH DIFFERENTIAL/PLATELET
Basophils Absolute: 0 10*3/uL (ref 0.0–0.1)
Basophils Relative: 0.4 % (ref 0.0–3.0)
Eosinophils Absolute: 0 10*3/uL (ref 0.0–0.7)
Eosinophils Relative: 0.9 % (ref 0.0–5.0)
HCT: 40.4 % (ref 39.0–52.0)
Hemoglobin: 13.2 g/dL (ref 13.0–17.0)
Lymphocytes Relative: 28.5 % (ref 12.0–46.0)
Lymphs Abs: 1 10*3/uL (ref 0.7–4.0)
MCHC: 32.6 g/dL (ref 30.0–36.0)
MCV: 94 fl (ref 78.0–100.0)
Monocytes Absolute: 0.4 10*3/uL (ref 0.1–1.0)
Monocytes Relative: 11.1 % (ref 3.0–12.0)
Neutro Abs: 2.1 10*3/uL (ref 1.4–7.7)
Neutrophils Relative %: 59.1 % (ref 43.0–77.0)
Platelets: 158 10*3/uL (ref 150.0–400.0)
RBC: 4.3 Mil/uL (ref 4.22–5.81)
RDW: 12.6 % (ref 11.5–14.6)
WBC: 3.6 10*3/uL — ABNORMAL LOW (ref 4.5–10.5)

## 2011-04-11 LAB — COMPREHENSIVE METABOLIC PANEL
ALT: 24 U/L (ref 0–53)
AST: 23 U/L (ref 0–37)
Albumin: 4.1 g/dL (ref 3.5–5.2)
Alkaline Phosphatase: 56 U/L (ref 39–117)
BUN: 14 mg/dL (ref 6–23)
CO2: 27 mEq/L (ref 19–32)
Calcium: 9.1 mg/dL (ref 8.4–10.5)
Chloride: 105 mEq/L (ref 96–112)
Creatinine, Ser: 1.1 mg/dL (ref 0.4–1.5)
GFR: 88.98 mL/min (ref >60.00)
Glucose, Bld: 84 mg/dL (ref 70–99)
Potassium: 4 mEq/L (ref 3.5–5.1)
Sodium: 137 mEq/L (ref 135–145)
Total Bilirubin: 0.4 mg/dL (ref 0.3–1.2)
Total Protein: 7.1 g/dL (ref 6.0–8.3)

## 2011-04-11 LAB — LIPID PANEL
Cholesterol: 119 mg/dL (ref 0–200)
HDL: 39.5 mg/dL (ref >39.00)
LDL Cholesterol: 57 mg/dL (ref 0–99)
Total CHOL/HDL Ratio: 3
Triglycerides: 113 mg/dL (ref 0.0–149.0)
VLDL: 22.6 mg/dL (ref 0.0–40.0)

## 2011-04-11 LAB — TSH: TSH: 1.58 u[IU]/mL (ref 0.35–5.50)

## 2011-04-11 NOTE — Progress Notes (Signed)
  Subjective:    Patient ID: Ronnie [name], male    DOB: 1960-12-10, 51 y.o.   MRN: [id number]  HPI CPX  Past Medical History: OSA --on  CPAP DVT RLE 2002 aprox, (-) anticoag panel palpitations (on betablokers) Asthma anxiety Allergic rhinitis GERD  Past Surgical History: Tonsillectomy septoplasty, turbinate reduction, UPPP  Family History: DM--no MI--no stroke-- no colon ca ca-- father dx age 37 prostate ca-- father dx age 96  Social History: Married, 1 child tobacco--never  ETOH-- rarely  exercise--very active at work, goes to [employer] often, more than before  diet-- described as healthy   Review of Systems  Constitutional: Negative for fever and fatigue.  Respiratory: Negative for cough and shortness of breath.   Cardiovascular: Negative for chest pain and leg swelling.  Gastrointestinal: Negative for abdominal pain and blood in stool.  Genitourinary: Negative for dysuria and hematuria.  Psychiatric/Behavioral:       No depression or anxiety        Objective:   Physical Exam   General:  alert, well-developed, and well-nourished.   Neck:  no thyromegaly   Lungs:  normal respiratory effort, no intercostal retractions, no accessory muscle use, and normal breath sounds.   Heart:  normal rate, regular rhythm, no murmur, and no gallop.   Abdomen:  soft, non-tender, no distention, no masses, no guarding, and no rigidity.   Extremities:  no pretibial edema bilaterally  Psych:  Cognition and judgment appear intact. Alert and cooperative with normal attention span and concentration.  not anxious appearing and not depressed appearing.        Assessment & Plan:

## 2011-04-11 NOTE — Patient Instructions (Signed)
Call Dr Candelaria Stagers , you are due for a colonoscopy 11-2011, if you need a referral to another MD let me know Next visit 1 year!

## 2011-04-11 NOTE — Assessment & Plan Note (Addendum)
Td 2005 Cscope 10-08 per pt (2 polyps, next 5 years) and EGD 2008  (Dr Candelaria Stagers) thus he is due for a colonoscopy,  pt will call Dr Candelaria Stagers and let me know if he will perform the cscope or if will need a referral PSAs per Urology, sees them 1 a year Continue with his healthy life style!

## 2011-04-11 NOTE — Telephone Encounter (Signed)
Please mail all lab results to this patient per his request at check out. Thank You

## 2011-04-13 ENCOUNTER — Encounter: Payer: Self-pay | Admitting: Internal Medicine

## 2011-04-14 NOTE — Telephone Encounter (Signed)
Dr. Drue Novel sent it.

## 2011-09-03 ENCOUNTER — Other Ambulatory Visit: Payer: Self-pay | Admitting: Internal Medicine

## 2011-10-27 ENCOUNTER — Telehealth: Payer: Self-pay | Admitting: Internal Medicine

## 2011-10-27 DIAGNOSIS — Z1211 Encounter for screening for malignant neoplasm of colon: Secondary | ICD-10-CM

## 2011-10-27 NOTE — Telephone Encounter (Signed)
Referral entered  

## 2011-10-27 NOTE — Telephone Encounter (Signed)
Please advise 

## 2011-10-27 NOTE — Telephone Encounter (Signed)
Please arrange a referral for colonoscopy, previous   scope was done by Dr Candelaria Stagers, refer to him or a [facility] MD

## 2011-10-27 NOTE — Telephone Encounter (Signed)
Pt called stated he was advised at V70-03/2011 he needed a Colonoscopy. Pt states his insurance requires a referral Cb# [phone]

## 2012-03-03 ENCOUNTER — Other Ambulatory Visit: Payer: Self-pay | Admitting: Internal Medicine

## 2012-03-03 ENCOUNTER — Encounter: Payer: Self-pay | Admitting: *Deleted

## 2012-03-03 NOTE — Telephone Encounter (Signed)
Refill done. Letter mailed to pt to let him know he is due for a CPE.

## 2012-04-07 ENCOUNTER — Other Ambulatory Visit: Payer: Self-pay | Admitting: Internal Medicine

## 2012-04-07 NOTE — Telephone Encounter (Signed)
Refill done.  

## 2012-05-10 ENCOUNTER — Ambulatory Visit (INDEPENDENT_AMBULATORY_CARE_PROVIDER_SITE_OTHER): Payer: BC Managed Care – PPO | Admitting: Internal Medicine

## 2012-05-10 ENCOUNTER — Encounter: Payer: Self-pay | Admitting: Internal Medicine

## 2012-05-10 VITALS — BP 112/70 | HR 55 | Temp 98.0°F | Ht 74.5 in | Wt 223.0 lb

## 2012-05-10 DIAGNOSIS — R002 Palpitations: Secondary | ICD-10-CM

## 2012-05-10 DIAGNOSIS — Z0000 Encounter for general adult medical examination without abnormal findings: Secondary | ICD-10-CM

## 2012-05-10 MED ORDER — ESOMEPRAZOLE MAGNESIUM 40 MG PO CPDR: 40.0000 mg | DELAYED_RELEASE_CAPSULE | Freq: Every day | ORAL | Status: DC

## 2012-05-10 NOTE — Patient Instructions (Signed)
Stay active, at least 3 hours a week

## 2012-05-10 NOTE — Assessment & Plan Note (Signed)
Well controlled on betablockers.  

## 2012-05-10 NOTE — Assessment & Plan Note (Signed)
Td 2005 Cscope 10-08 per pt (2 polyps) cscope 11-2011 , next 5 years  EGD 2008  (Dr Candelaria Stagers)  PSAs per Urology, sees them 1 a year Needs to imporove life style, discussed  Labs  RTC 1 year

## 2012-05-10 NOTE — Progress Notes (Signed)
  Subjective:    Patient ID: Ronnie [name], male    DOB: 12-07-1960, 52 y.o.   MRN: [id number]  HPI Complete physical exam  Past Medical History  Diagnosis Date  . Allergic rhinitis   . GERD (gastroesophageal reflux disease)   . OSA on CPAP   . DVT (deep venous thrombosis)     RLE 2002 aprox, (-) anticoag panel  . Palpitations     on betablokers  . Asthma   . Anxiety    Past Surgical History  Procedure Laterality Date  . Tonsillectomy    . Nasal septoplasty w/ turbinoplasty     History   Social History  . Marital Status: Married    Spouse Name: N/A    Number of Children: 1  . Years of Education: N/A   Occupational History  . managment- [employer]    Social History Main Topics  . Smoking status: Never Smoker   . Smokeless tobacco: Never Used  . Alcohol Use: Yes     Comment: rare  . Drug Use: No  . Sexually Active: Not on file   Other Topics Concern  . Not on file   Social History Narrative   Exercise- very active at work, no recent gym    Diet- rarely has  fast food, trying portion control and eat low fat            Family History  Problem Relation Age of Onset  . Diabetes Neg Hx   . Heart attack Neg Hx   . Stroke Neg Hx   . Colon cancer Father 3  . Prostate cancer Father 5    Review of Systems In general feeling well, had a cold a few weeks ago, feeling better now. History of asthma, essentially asymptomatic. History of GERD, symptoms only present whenever he skips a meal. No chest or shortness or breath No nausea, vomiting, diarrhea or blood in stools. No dysuria or gross hematuria. No anxiety or depression per se.     Objective:   Physical Exam General -- alert, well-developed, VSS.   Neck --no thyromegaly , normal carotid pulse Lungs -- normal respiratory effort, no intercostal retractions, no accessory muscle use, and normal breath sounds.   Heart-- normal rate, regular rhythm, no murmur, and no gallop.   Abdomen--soft,  non-tender, no distention, no masses, no HSM, no guarding, and no rigidity.   Extremities-- no pretibial edema bilaterally Neurologic-- alert & oriented X3 and strength normal in all extremities. Psych-- Cognition and judgment appear intact. Alert and cooperative with normal attention span and concentration.  not anxious appearing and not depressed appearing.       Assessment & Plan:

## 2012-05-11 LAB — CBC WITH DIFFERENTIAL/PLATELET
Basophils Absolute: 0.1 10*3/uL (ref 0.0–0.1)
Basophils Relative: 1 % (ref 0.0–3.0)
Eosinophils Absolute: 0 10*3/uL (ref 0.0–0.7)
Eosinophils Relative: 0.4 % (ref 0.0–5.0)
HCT: 41.4 % (ref 39.0–52.0)
Hemoglobin: 13.5 g/dL (ref 13.0–17.0)
Lymphocytes Relative: 25.7 % (ref 12.0–46.0)
Lymphs Abs: 1.5 10*3/uL (ref 0.7–4.0)
MCHC: 32.7 g/dL (ref 30.0–36.0)
MCV: 92.7 fl (ref 78.0–100.0)
Monocytes Absolute: 0.5 10*3/uL (ref 0.1–1.0)
Monocytes Relative: 8.2 % (ref 3.0–12.0)
Neutro Abs: 3.7 10*3/uL (ref 1.4–7.7)
Neutrophils Relative %: 64.7 % (ref 43.0–77.0)
Platelets: 188 10*3/uL (ref 150.0–400.0)
RBC: 4.46 Mil/uL (ref 4.22–5.81)
RDW: 13.1 % (ref 11.5–14.6)
WBC: 5.7 10*3/uL (ref 4.5–10.5)

## 2012-05-11 LAB — COMPREHENSIVE METABOLIC PANEL
ALT: 21 U/L (ref 0–53)
AST: 20 U/L (ref 0–37)
Albumin: 4.2 g/dL (ref 3.5–5.2)
Alkaline Phosphatase: 58 U/L (ref 39–117)
BUN: 12 mg/dL (ref 6–23)
CO2: 28 mEq/L (ref 19–32)
Calcium: 9.3 mg/dL (ref 8.4–10.5)
Chloride: 105 mEq/L (ref 96–112)
Creatinine, Ser: 0.9 mg/dL (ref 0.4–1.5)
GFR: 112.3 mL/min (ref >60.00)
Glucose, Bld: 74 mg/dL (ref 70–99)
Potassium: 3.7 mEq/L (ref 3.5–5.1)
Sodium: 140 mEq/L (ref 135–145)
Total Bilirubin: 0.9 mg/dL (ref 0.3–1.2)
Total Protein: 7.6 g/dL (ref 6.0–8.3)

## 2012-05-11 LAB — LIPID PANEL
Cholesterol: 126 mg/dL (ref 0–200)
HDL: 45 mg/dL (ref >39.00)
LDL Cholesterol: 61 mg/dL (ref 0–99)
Total CHOL/HDL Ratio: 3
Triglycerides: 102 mg/dL (ref 0.0–149.0)
VLDL: 20.4 mg/dL (ref 0.0–40.0)

## 2012-05-11 LAB — TSH: TSH: 1.83 u[IU]/mL (ref 0.35–5.50)

## 2012-05-13 ENCOUNTER — Encounter: Payer: Self-pay | Admitting: *Deleted

## 2012-10-05 ENCOUNTER — Emergency Department (HOSPITAL_COMMUNITY)
Admission: EM | Admit: 2012-10-05 | Discharge: 2012-10-05 | Disposition: A | Discharge status: Home / Self Care | Payer: BC Managed Care – PPO | Attending: Emergency Medicine | Admitting: Emergency Medicine

## 2012-10-05 ENCOUNTER — Encounter (HOSPITAL_COMMUNITY): Payer: Self-pay | Admitting: Emergency Medicine

## 2012-10-05 DIAGNOSIS — K219 Gastro-esophageal reflux disease without esophagitis: Secondary | ICD-10-CM | POA: Insufficient documentation

## 2012-10-05 DIAGNOSIS — G4733 Obstructive sleep apnea (adult) (pediatric): Secondary | ICD-10-CM | POA: Insufficient documentation

## 2012-10-05 DIAGNOSIS — Z86718 Personal history of other venous thrombosis and embolism: Secondary | ICD-10-CM | POA: Insufficient documentation

## 2012-10-05 DIAGNOSIS — Z79899 Other long term (current) drug therapy: Secondary | ICD-10-CM | POA: Insufficient documentation

## 2012-10-05 DIAGNOSIS — Z8659 Personal history of other mental and behavioral disorders: Secondary | ICD-10-CM | POA: Insufficient documentation

## 2012-10-05 DIAGNOSIS — R21 Rash and other nonspecific skin eruption: Secondary | ICD-10-CM | POA: Insufficient documentation

## 2012-10-05 DIAGNOSIS — J45909 Unspecified asthma, uncomplicated: Secondary | ICD-10-CM | POA: Insufficient documentation

## 2012-10-05 MED ORDER — DEXAMETHASONE SODIUM PHOSPHATE 10 MG/ML IJ SOLN
10.0000 mg | Freq: Once | INTRAMUSCULAR | Status: AC
  Administered 2012-10-05: 10 mg via INTRAVENOUS
  Filled 2012-10-05: qty 1

## 2012-10-05 NOTE — ED Provider Notes (Signed)
CSN: [id number]     Arrival date & time 10/05/12  1524 History  This chart was scribed for non-physician practitioner Roxy Horseman, PA-C, working with Layla Maw Ward, DO, by Yevette Edwards, ED Scribe. This patient was seen in room WTR7/WTR7 and the patient's care was started at 4:35 PM.    First MD Initiated Contact with Patient 10/05/12 1616     Chief Complaint  Patient presents with  . Rash    The history is provided by the patient. No language interpreter was used.   HPI Comments: Ronnie [name] is a 52 y.o. male who presents to the Emergency Department whose chief complain is a rash to his arms bilaterally and torso which began three hours ago. The pt reports he has experienced itching associated with the rash. He has attempted to mitigate his symptoms with 25 mg of Benadryl. He noticed the rash approximately half an hour after lunch; he had bread with his lunch. He has a h/o allergic reaction to bread, and he has a h/o other food allergies He denies experiencing any SOB, constriction of his throat, nausea, emesis, fever, or chills. Last week, he did have a fever.  The pt denies DM.    Past Medical History  Diagnosis Date  . Allergic rhinitis   . GERD (gastroesophageal reflux disease)   . OSA on CPAP   . DVT (deep venous thrombosis)     RLE 2002 aprox, (-) anticoag panel  . Palpitations     on betablokers  . Asthma   . Anxiety    Past Surgical History  Procedure Laterality Date  . Tonsillectomy    . Nasal septoplasty w/ turbinoplasty     Family History  Problem Relation Age of Onset  . Diabetes Neg Hx   . Heart attack Neg Hx   . Stroke Neg Hx   . Colon cancer Father 16  . Prostate cancer Father 40   History  Substance Use Topics  . Smoking status: Never Smoker   . Smokeless tobacco: Never Used  . Alcohol Use: Yes     Comment: rare    Review of Systems  A complete 10 system review of systems was obtained, and all systems were negative except where indicated  in the HPI and PE.   Allergies  Review of patient's allergies indicates no known allergies.  Home Medications   Current Outpatient Rx  Name  Route  Sig  Dispense  Refill  . Aspirin-Acetaminophen-Caffeine (GOODY HEADACHE PO)   Oral   Take 1 packet by mouth daily as needed (for pain).         Marland Kitchen esomeprazole (NEXIUM) 40 MG capsule   Oral   Take 1 capsule (40 mg total) by mouth daily before breakfast.   90 capsule   3   . metoprolol succinate (TOPROL-XL) 25 MG 24 hr tablet   Oral   Take 25 mg by mouth daily.           . Multiple Vitamin (MULTIVITAMIN WITH MINERALS) TABS tablet   Oral   Take 1 tablet by mouth daily.          Triage Vitals: BP 124/69  Pulse 63  Temp(Src) 98.9 F (37.2 C) (Oral)  Resp 16  SpO2 99%  Physical Exam  Nursing note and vitals reviewed. Constitutional: He is oriented to person, place, and time. He appears well-developed and well-nourished. No distress.  HENT:  Head: Normocephalic and atraumatic.  Airway is intact, no evidence of swelling  Eyes: EOM are normal.  Neck: Neck supple. No tracheal deviation present.  Cardiovascular: Normal rate.   Pulmonary/Chest: Effort normal. No respiratory distress.  Musculoskeletal: Normal range of motion.  Neurological: He is alert and oriented to person, place, and time.  Skin: Skin is warm and dry.  Very mild maculopapular rash to extremities and torso.  Psychiatric: He has a normal mood and affect. His behavior is normal.    ED Course   DIAGNOSTIC STUDIES:  Oxygen Saturation is 99% on room air, normal by my interpretation.    COORDINATION OF CARE:  4:38 PM- Discussed treatment plan with patient which includes a steroid injection, and the patient agreed to the plan.   Procedures (including critical care time)  Labs Reviewed - No data to display No results found. 1. Rash     MDM  Patient with rash, I believe this to be a food allergy. It has been approximately 4 hours since patient ate  some bread. He states that he has had allergic reactions to bread in the past. He tried taking some Benadryl, which she states has helped his symptoms. I gave him Decadron in the emergency department and observed him for approximately 2 hours. He had a reduction of symptoms. He is stable and ready for discharge.  I personally performed the services described in this documentation, which was scribed in my presence. The recorded information has been reviewed and is accurate.    Roxy Horseman, PA-C 10/05/12 [phone]

## 2012-10-05 NOTE — ED Notes (Signed)
Pt c/o of rash to arms and front torso that start 1 hour ago. Also c/o of itching to site. States that he does not know what caused it. Tool 25mg  of Benadryl.

## 2012-10-06 NOTE — ED Provider Notes (Signed)
Medical screening examination/treatment/procedure(s) were performed by non-physician practitioner and as supervising physician I was immediately available for consultation/collaboration.  Kristen N Ward, DO 10/06/12 0010 

## 2013-01-28 ENCOUNTER — Encounter (HOSPITAL_COMMUNITY): Payer: Self-pay | Admitting: Emergency Medicine

## 2013-01-28 ENCOUNTER — Emergency Department (HOSPITAL_COMMUNITY)
Admission: EM | Admit: 2013-01-28 | Discharge: 2013-01-28 | Disposition: A | Discharge status: Home / Self Care | Payer: BC Managed Care – PPO | Attending: Emergency Medicine | Admitting: Emergency Medicine

## 2013-01-28 ENCOUNTER — Encounter: Payer: Self-pay | Admitting: Internal Medicine

## 2013-01-28 DIAGNOSIS — Z86718 Personal history of other venous thrombosis and embolism: Secondary | ICD-10-CM | POA: Insufficient documentation

## 2013-01-28 DIAGNOSIS — T4995XA Adverse effect of unspecified topical agent, initial encounter: Secondary | ICD-10-CM | POA: Insufficient documentation

## 2013-01-28 DIAGNOSIS — G4733 Obstructive sleep apnea (adult) (pediatric): Secondary | ICD-10-CM | POA: Insufficient documentation

## 2013-01-28 DIAGNOSIS — L509 Urticaria, unspecified: Secondary | ICD-10-CM | POA: Insufficient documentation

## 2013-01-28 DIAGNOSIS — K219 Gastro-esophageal reflux disease without esophagitis: Secondary | ICD-10-CM | POA: Insufficient documentation

## 2013-01-28 DIAGNOSIS — Z79899 Other long term (current) drug therapy: Secondary | ICD-10-CM | POA: Insufficient documentation

## 2013-01-28 DIAGNOSIS — J45909 Unspecified asthma, uncomplicated: Secondary | ICD-10-CM | POA: Insufficient documentation

## 2013-01-28 DIAGNOSIS — T7840XA Allergy, unspecified, initial encounter: Secondary | ICD-10-CM

## 2013-01-28 DIAGNOSIS — Z8659 Personal history of other mental and behavioral disorders: Secondary | ICD-10-CM | POA: Insufficient documentation

## 2013-01-28 MED ORDER — DEXAMETHASONE SODIUM PHOSPHATE 10 MG/ML IJ SOLN
10.0000 mg | Freq: Once | INTRAMUSCULAR | Status: AC
  Administered 2013-01-28: 10 mg via INTRAMUSCULAR
  Filled 2013-01-28: qty 1

## 2013-01-28 MED ORDER — DIPHENHYDRAMINE HCL 25 MG PO TABS: 25.0000 mg | ORAL_TABLET | Freq: Four times a day (QID) | ORAL | Status: DC

## 2013-01-28 MED ORDER — FAMOTIDINE 20 MG PO TABS: 20.0000 mg | ORAL_TABLET | Freq: Two times a day (BID) | ORAL | Status: DC

## 2013-01-28 MED ORDER — FAMOTIDINE 20 MG PO TABS
20.0000 mg | ORAL_TABLET | Freq: Once | ORAL | Status: AC
  Administered 2013-01-28: 20 mg via ORAL
  Filled 2013-01-28: qty 1

## 2013-01-28 NOTE — ED Notes (Signed)
Pt broke out in rash on back, torso, and groin today that did burn and itch but not so bad at this time. Pt states he took a Benadryl about hour ago. Pt states this happened beofre about month or so ago and was given steroids and IV then sent home.

## 2013-01-28 NOTE — ED Provider Notes (Signed)
Medical screening examination/treatment/procedure(s) were performed by non-physician practitioner and as supervising physician I was immediately available for consultation/collaboration.  EKG Interpretation   None          Gilda Crease, MD 01/28/13 [phone]

## 2013-01-28 NOTE — ED Provider Notes (Signed)
CSN: [id number]     Arrival date & time 01/28/13  1708 History  This chart was scribed for Ronnie Helper, PA-C, working with Gilda Crease, * by Blanchard Kelch, ED Scribe. This patient was seen in room WTR8/WTR8 and the patient's care was started at 6:27 PM.    Chief Complaint  Patient presents with  . Rash    Patient is a 52 y.o. male presenting with rash. The history is provided by the patient. No language interpreter was used.  Rash Associated symptoms: no diarrhea, no fever, no headaches, no nausea, no shortness of breath and not vomiting      HPI Comments: ERVAN [name] is a 52 y.o. male with a history of food allergies who presents to the Emergency Department complaining of a constant rash that started developing about two hours ago. The rash started on his back but has since spread to his face and arms.  He describes it as itching. He reports taking Benadryl as soon as he noticed the rash developing with mild relief. It feels similar to prior episodes of allergic reactions. He ate roast beef, potatoes, cabbage and corn bread from a cafeteria, all of which he has had before, four hours before the rash appeared. He also reports using Pine Sol to clean two hours before the rash developed, but he has also used it in the past without complication. He denies any difficulty breathing, nausea, vomiting, fever, headache, or diarrhea. He denies any soap, detergent, medication or lotion changes. He denies any new pets or recent travel.   Past Medical History  Diagnosis Date  . Allergic rhinitis   . GERD (gastroesophageal reflux disease)   . OSA on CPAP   . DVT (deep venous thrombosis)     RLE 2002 aprox, (-) anticoag panel  . Palpitations     on betablokers  . Asthma   . Anxiety    Past Surgical History  Procedure Laterality Date  . Tonsillectomy    . Nasal septoplasty w/ turbinoplasty     Family History  Problem Relation Age of Onset  . Diabetes Neg Hx   . Heart attack Neg Hx    . Stroke Neg Hx   . Colon cancer Father 36  . Prostate cancer Father 4   History  Substance Use Topics  . Smoking status: Never Smoker   . Smokeless tobacco: Never Used  . Alcohol Use: Yes     Comment: rare    Review of Systems  Constitutional: Negative for fever.  Respiratory: Negative for shortness of breath.   Gastrointestinal: Negative for nausea, vomiting and diarrhea.  Skin: Positive for rash.  Neurological: Negative for headaches.    Allergies  Review of patient's allergies indicates no known allergies.  Home Medications   Current Outpatient Rx  Name  Route  Sig  Dispense  Refill  . Aspirin-Acetaminophen-Caffeine (GOODY HEADACHE PO)   Oral   Take 1 packet by mouth daily as needed (for pain).         Marland Kitchen esomeprazole (NEXIUM) 40 MG capsule   Oral   Take 1 capsule (40 mg total) by mouth daily before breakfast.   90 capsule   3   . metoprolol succinate (TOPROL-XL) 25 MG 24 hr tablet   Oral   Take 25 mg by mouth daily.           . Multiple Vitamin (MULTIVITAMIN WITH MINERALS) TABS tablet   Oral   Take 1 tablet by mouth daily.  Triage Vitals: BP 120/66  Pulse 65  Resp 17  SpO2 98%  Physical Exam  Nursing note and vitals reviewed. Constitutional: He is oriented to person, place, and time. He appears well-developed and well-nourished. No distress.  HENT:  Head: Normocephalic and atraumatic.  Oral mucosal normal. No tongue or lip edema.  Eyes: EOM are normal.  Neck: Neck supple. No tracheal deviation present.  Cardiovascular: Normal rate.   Pulmonary/Chest: Effort normal. No respiratory distress.  Musculoskeletal: Normal range of motion.  Neurological: He is alert and oriented to person, place, and time.  Skin: Skin is warm and dry. Rash noted.  Urticarial rash noted to back, face, left and right arm,  Psychiatric: He has a normal mood and affect. His behavior is normal.    ED Course  Procedures (including critical care  time)  DIAGNOSTIC STUDIES: Oxygen Saturation is 98% on room air, normal by my interpretation.    COORDINATION OF CARE: 6:32 PM -Will give pt steroid injection in ER and advise to take another Benadryl in a few hours. Patient verbalizes understanding and agrees with treatment plan.  7:38 PM No worsening of sxs.  Pt request to be discharge, felt better.  Pt has received decadron, and pepcid.  Will d/c with benadryl/pepcid.  Return precaution discussed.    Labs Review Labs Reviewed - No data to display Imaging Review No results found.  EKG Interpretation   None       MDM   1. Allergic reaction, initial encounter    BP 120/66  Pulse 65  Resp 17  SpO2 98%  I personally performed the services described in this documentation, which was scribed in my presence. The recorded information has been reviewed and is accurate.     Ronnie Helper, PA-C 01/28/13 1939

## 2013-05-11 ENCOUNTER — Telehealth: Payer: Self-pay

## 2013-05-11 NOTE — Telephone Encounter (Signed)
Medication List and allergies:  Reviewed and updated  90 day supply/mail order: na Local prescriptions: [facility] on [address]  Immunizations due: Tdap  A/P:   No changes to FH, PSH or Personal Hx Flu vaccine--declines Tdap--due last 2005 [facility]--11/2011--Dr Medoff--per patient PSA--see urology yearly  To Discuss with Provider: Not at this time.

## 2013-05-12 ENCOUNTER — Encounter: Payer: Self-pay | Admitting: Internal Medicine

## 2013-05-12 ENCOUNTER — Ambulatory Visit (INDEPENDENT_AMBULATORY_CARE_PROVIDER_SITE_OTHER): Payer: BC Managed Care – PPO | Admitting: Internal Medicine

## 2013-05-12 VITALS — BP 111/69 | HR 60 | Temp 97.9°F | Ht 75.0 in | Wt 222.0 lb

## 2013-05-12 DIAGNOSIS — Z23 Encounter for immunization: Secondary | ICD-10-CM

## 2013-05-12 DIAGNOSIS — Z0000 Encounter for general adult medical examination without abnormal findings: Secondary | ICD-10-CM

## 2013-05-12 LAB — CBC WITH DIFFERENTIAL/PLATELET
Basophils Absolute: 0 10*3/uL (ref 0.0–0.1)
Basophils Relative: 0.5 % (ref 0.0–3.0)
Eosinophils Absolute: 0 10*3/uL (ref 0.0–0.7)
Eosinophils Relative: 1.1 % (ref 0.0–5.0)
HCT: 39.9 % (ref 39.0–52.0)
Hemoglobin: 12.9 g/dL — ABNORMAL LOW (ref 13.0–17.0)
Lymphocytes Relative: 37.2 % (ref 12.0–46.0)
Lymphs Abs: 1.1 10*3/uL (ref 0.7–4.0)
MCHC: 32.3 g/dL (ref 30.0–36.0)
MCV: 93.8 fl (ref 78.0–100.0)
Monocytes Absolute: 0.4 10*3/uL (ref 0.1–1.0)
Monocytes Relative: 12.7 % — ABNORMAL HIGH (ref 3.0–12.0)
Neutro Abs: 1.5 10*3/uL (ref 1.4–7.7)
Neutrophils Relative %: 48.5 % (ref 43.0–77.0)
Platelets: 182 10*3/uL (ref 150.0–400.0)
RBC: 4.25 Mil/uL (ref 4.22–5.81)
RDW: 12.6 % (ref 11.5–14.6)
WBC: 3.1 10*3/uL — ABNORMAL LOW (ref 4.5–10.5)

## 2013-05-12 LAB — COMPREHENSIVE METABOLIC PANEL
ALT: 19 U/L (ref 0–53)
AST: 22 U/L (ref 0–37)
Albumin: 4.2 g/dL (ref 3.5–5.2)
Alkaline Phosphatase: 55 U/L (ref 39–117)
BUN: 8 mg/dL (ref 6–23)
CO2: 30 mEq/L (ref 19–32)
Calcium: 9.3 mg/dL (ref 8.4–10.5)
Chloride: 105 mEq/L (ref 96–112)
Creatinine, Ser: 1 mg/dL (ref 0.4–1.5)
GFR: 99.28 mL/min (ref >60.00)
Glucose, Bld: 97 mg/dL (ref 70–99)
Potassium: 4.1 mEq/L (ref 3.5–5.1)
Sodium: 139 mEq/L (ref 135–145)
Total Bilirubin: 0.6 mg/dL (ref 0.3–1.2)
Total Protein: 7.1 g/dL (ref 6.0–8.3)

## 2013-05-12 LAB — LIPID PANEL
Cholesterol: 115 mg/dL (ref 0–200)
HDL: 40.8 mg/dL (ref >39.00)
LDL Cholesterol: 58 mg/dL (ref 0–99)
Total CHOL/HDL Ratio: 3
Triglycerides: 83 mg/dL (ref 0.0–149.0)
VLDL: 16.6 mg/dL (ref 0.0–40.0)

## 2013-05-12 LAB — TSH: TSH: 0.92 u[IU]/mL (ref 0.35–5.50)

## 2013-05-12 NOTE — Assessment & Plan Note (Addendum)
Td 2015 Cscope 10-08 per pt (2 polyps) cscope 11-2011 , next 5 years (Dr Candelaria StagersMeadoff) EGD 2008  (Dr Candelaria StagersMeadoff)  PSAs per Urology, sees them 1 a year Doing better w/ life style  Labs  RTC 1 year Chronic medical problems seems well-controlled. Denies a history of asthma. History of DVT, reminded signs and symptoms of a clot and prevention discussed.

## 2013-05-12 NOTE — Patient Instructions (Signed)
Get your blood work before you leave   Next visit is for a physical exam in 1 year, fasting Please make an appointment    

## 2013-05-12 NOTE — Progress Notes (Signed)
Pre visit review using our clinic review tool, if applicable. No additional management support is needed unless otherwise documented below in the visit note. 

## 2013-05-12 NOTE — Progress Notes (Signed)
Subjective:    Patient ID: Ronnie [name], male    DOB: [birth year], 53 y.o.   MRN: [id number]  DOS:  05/12/2013 Type of  visit: CPX   ROS Diet-- healthy most of the time Exercise-- takes a walk x 2/week, some exercise at home   No  CP, SOB. occ  palpitations, no lower extremity edema Denies  nausea, vomiting diarrhea Denies  blood in the stools (-) cough, sputum production (-) wheezing, chest congestion No dysuria, gross hematuria, difficulty urinating  No anxiety, depression   Past Medical History  Diagnosis Date  . Allergic rhinitis   . GERD (gastroesophageal reflux disease)   . OSA on CPAP   . DVT (deep venous thrombosis)     RLE 2002 aprox, (-) anticoag panel  . Palpitations     on betablokers  . Anxiety     Past Surgical History  Procedure Laterality Date  . Tonsillectomy    . Nasal septoplasty w/ turbinoplasty      History   Social History  . Marital Status: Married    Spouse Name: N/A    Number of Children: 1  . Years of Education: N/A   Occupational History  . managment- [employer]    Social History Main Topics  . Smoking status: Never Smoker   . Smokeless tobacco: Never Used  . Alcohol Use: Yes     Comment: rare  . Drug Use: No  . Sexual Activity: Not on file   Other Topics Concern  . Not on file   Social History Narrative   Lives w/ wife   Child born 54                 Family History  Problem Relation Age of Onset  . Diabetes Neg Hx   . Heart attack Neg Hx   . Stroke Neg Hx   . Colon cancer Father 14    alive  . Prostate cancer Father 68      Medication List       This list is accurate as of: 05/12/13  2:52 PM.  Always use your most recent med list.               diphenhydrAMINE 25 MG tablet  Commonly known as:  BENADRYL  Take 1 tablet (25 mg total) by mouth every 6 (six) hours.     esomeprazole 40 MG capsule  Commonly known as:  NEXIUM  Take 1 capsule (40 mg total) by mouth daily before breakfast.     GOODY HEADACHE PO  Take 1 packet by mouth daily as needed (for pain).     metoprolol succinate 25 MG 24 hr tablet  Commonly known as:  TOPROL-XL  Take 25 mg by mouth daily.     multivitamin with minerals Tabs tablet  Take 1 tablet by mouth daily.           Objective:   Physical Exam BP 111/69  Pulse 60  Temp(Src) 97.9 F (36.6 C)  Ht 6\' 3"  (1.905 m)  Wt 222 lb (100.699 kg)  BMI 27.75 kg/m2  SpO2 100%  General -- alert, well-developed, NAD.  Neck --no thyromegaly , normal carotid pulse, no bruit HEENT-- Not pale.  Lungs -- normal respiratory effort, no intercostal retractions, no accessory muscle use, and normal breath sounds.  Heart-- normal rate, regular rhythm, no murmur.  Abdomen-- Not distended, good bowel sounds,soft, non-tender. Extremities-- no pretibial edema bilaterally  Neurologic--  alert & oriented X3.  Speech normal, gait normal, strength normal in all extremities.  Psych-- Cognition and judgment appear intact. Cooperative with normal attention span and concentration. No anxious or depressed appearing.      Assessment & Plan:

## 2013-05-16 ENCOUNTER — Encounter: Payer: Self-pay | Admitting: *Deleted

## 2013-05-27 ENCOUNTER — Other Ambulatory Visit: Payer: Self-pay | Admitting: Internal Medicine

## 2013-07-04 ENCOUNTER — Telehealth: Payer: Self-pay | Admitting: Internal Medicine

## 2013-07-04 NOTE — Telephone Encounter (Signed)
Lmomtcb x1 for pt  Do not see when pt was last seen.

## 2013-07-04 NOTE — Telephone Encounter (Signed)
Pt using CPAP currently.  [facility] Pt requesting new machine/supplies--pt states that current machines seems to be functioning properly but is very outdated. Pt scheduled for Consult with Dr Shelle Ironlance 08/01/13 at 11:45 Aware to arrive 15-6530mins prior to appt time to fill out paperwork.  Pt also aware to bring CPAP machine   Pt requests that if any appts come open between now and his 6/15 appt that he be notified.  I explained to the patient that I will send this message to Dr Shelle Ironlance nurse to keep an eye out but that it is probably best for him to contact us for openings.   Nothing further needed.

## 2013-07-08 ENCOUNTER — Telehealth: Payer: Self-pay | Admitting: Internal Medicine

## 2013-07-08 DIAGNOSIS — K219 Gastro-esophageal reflux disease without esophagitis: Secondary | ICD-10-CM

## 2013-07-08 MED ORDER — ESOMEPRAZOLE MAGNESIUM 40 MG PO CPDR: DELAYED_RELEASE_CAPSULE | ORAL | Status: DC

## 2013-07-08 NOTE — Telephone Encounter (Signed)
Rx for brand name Nexium sent to [facility] on [address]

## 2013-07-08 NOTE — Telephone Encounter (Signed)
Caller name: Arnav  Call back number:[phone] Pharmacy:[id number] - [place],  - [phone]   Reason for call:   Pt went to get RX esomeprazole (NEXIUM) 40 MG capsule filled, and got the generic.  Pt states that the generic does not work and makes his stomach hurt.  Does he need to contact pharmacy to make sure they fill the Brand Name or does he need to contact insurance. Please advise.

## 2013-08-01 ENCOUNTER — Encounter (INDEPENDENT_AMBULATORY_CARE_PROVIDER_SITE_OTHER): Payer: Self-pay

## 2013-08-01 ENCOUNTER — Ambulatory Visit (INDEPENDENT_AMBULATORY_CARE_PROVIDER_SITE_OTHER): Payer: BC Managed Care – PPO | Admitting: Pulmonary Disease

## 2013-08-01 ENCOUNTER — Encounter: Payer: Self-pay | Admitting: Pulmonary Disease

## 2013-08-01 VITALS — BP 120/80 | HR 73 | Temp 98.1°F | Ht 75.0 in | Wt 223.2 lb

## 2013-08-01 DIAGNOSIS — G4733 Obstructive sleep apnea (adult) (pediatric): Secondary | ICD-10-CM

## 2013-08-01 NOTE — Patient Instructions (Signed)
Will get you a new cpap machine and set on the auto setting.  Let us know if you prefer the fixed pressure setting. Work on weight loss followup with me again in one year if doing well.

## 2013-08-01 NOTE — Assessment & Plan Note (Signed)
The patient has a history of severe obstructive sleep apnea, and has been staying compliantly on CPAP. He has really not had anyone managing his sleep apnea so far, and is overdue for a new CPAP device. It appears that his CPAP pressure was never optimized, and we will therefore use the automatic setting for treatment to see how he does. I have also encouraged him to work aggressively on weight loss. As long as he is doing well, I will see him back in one year.

## 2013-08-01 NOTE — Progress Notes (Signed)
Subjective:    Patient ID: Ronnie [name], male    DOB: 22-May-1960, 53 y.o.   MRN: [id number]  HPI The patient is a 53 year old male who I've been asked to see for management of obstructive sleep apnea. His sleep study was done in 2005, and showed an AHI of 43 events per hour. He was started on CPAP at a moderate pressure level, but unfortunately never followed up to have his pressure optimized. He feels that he has done very well with CPAP, and continues to wear the device compliantly. He is currently using his original machine from 2005. He feels that he sleeps well during the night, and feels rested in the mornings upon arising. He has no significant sleepiness during the day, although he has some sleep pressure in the evening watching television or movies. The patient states that his weight is down 20 pounds since his original sleep study, and his Epworth score today is normal at 4.   Sleep Questionnaire What time do you typically go to bed?( Between what hours) 10-11:30pm 10-11:30pm at 1215 on 08/01/13 by Darrell JewelJennifer R Castillo, CMA How long does it take you to fall asleep? 1 minute 1 minute at 1215 on 08/01/13 by Darrell JewelJennifer R Castillo, CMA How many times during the night do you wake up? 1 1 at 1215 on 08/01/13 by Darrell JewelJennifer R Castillo, CMA What time do you get out of bed to start your day? 0600 0600 at 1215 on 08/01/13 by Darrell JewelJennifer R Castillo, CMA Do you drive or operate heavy machinery in your occupation? No No at 1215 on 08/01/13 by Darrell JewelJennifer R Castillo, CMA How much has your weight changed (up or down) over the past two years? (In pounds) 5 lb (2.268 kg) 5 lb (2.268 kg) at 1215 on 08/01/13 by Darrell JewelJennifer R Castillo, CMA Have you ever had a sleep study before? Yes Yes at 1215 on 08/01/13 by Darrell JewelJennifer R Castillo, CMA If yes, [facility] at [phone] on 08/01/13 by Darrell JewelJennifer R Castillo, CMA If yes, date of study? 04-10-2003 04-10-2003 at 1215 on 08/01/13 by  Darrell JewelJennifer R Castillo, CMA Do you currently use CPAP? Yes Yes at 1215 on 08/01/13 by Darrell JewelJennifer R Castillo, CMA If so, what pressure? 10cm 10cm at 1215 on 08/01/13 by Darrell JewelJennifer R Castillo, CMA Do you wear oxygen at any time? No No at 1215 on 08/01/13 by Darrell JewelJennifer R Castillo, CMA   Review of Systems  Constitutional: Negative for fever and unexpected weight change.  HENT: Positive for sneezing. Negative for congestion, dental problem, ear pain, nosebleeds, postnasal drip, rhinorrhea, sinus pressure, sore throat and trouble swallowing.   Eyes: Negative for redness and itching.  Respiratory: Negative for cough, chest tightness, shortness of breath and wheezing.   Cardiovascular: Negative for palpitations and leg swelling.  Gastrointestinal: Negative for nausea and vomiting.  Genitourinary: Negative for dysuria.  Musculoskeletal: Negative for joint swelling.  Skin: Negative for rash.  Neurological: Negative for headaches.  Hematological: Does not bruise/bleed easily.  Psychiatric/Behavioral: Negative for dysphoric mood. The patient is not nervous/anxious.        Objective:   Physical Exam Constitutional:  Well developed, no acute distress  HENT:  Nares patent without discharge  Oropharynx without exudate, palate and uvula are thickened and elongated  Eyes:  Perrla, eomi, no scleral icterus  Neck:  No JVD, no TMG  Cardiovascular:  Normal rate, regular rhythm, no rubs or gallops.  No murmurs        Intact  distal pulses  Pulmonary :  Normal breath sounds, no stridor or respiratory distress   No rales, rhonchi, or wheezing  Abdominal:  Soft, nondistended, bowel sounds present.  No tenderness noted.   Musculoskeletal:  No lower extremity edema noted.  Lymph Nodes:  No cervical lymphadenopathy noted  Skin:  No cyanosis noted  Neurologic:  Alert, appropriate, moves all 4 extremities without obvious deficit.         Assessment & Plan:

## 2013-08-04 ENCOUNTER — Telehealth: Payer: Self-pay | Admitting: Pulmonary Disease

## 2013-08-04 NOTE — Telephone Encounter (Signed)
Per OV 08/01/13: Patient Instructions      Will get you a new cpap machine and set on the auto setting.  Let us know if you prefer the fixed pressure setting. Work on weight loss followup with me again in one year if doing well.    --   Called spoke with Melissa. She reports if pt CPAP needs to be repaired they will provide pt with a loaner. [facility].

## 2013-08-05 NOTE — Telephone Encounter (Signed)
Called [name] and made her aware of [facility] recs.  Called pt LMTCB x1

## 2013-08-05 NOTE — Telephone Encounter (Signed)
Please let pt know that if [facility] is slow to either get his machine fixed or to get him a new device, then he needs to let us know immediately so that we can intervene. Let advanced know that he still needs an auto on 5-20 as ordered with download to me.

## 2013-08-08 NOTE — Telephone Encounter (Signed)
LMTCB

## 2013-08-08 NOTE — Telephone Encounter (Signed)
Spoke with Becky with [facility]  She states that they have been trying to contact the pt to let him know that his CPAP was ready but apparently the wrong cell number  I gave them the correct number and they will call him this am  Pt aware  Nothing further needed

## 2013-08-08 NOTE — Telephone Encounter (Signed)
lmctb x2 for pt

## 2013-08-08 NOTE — Telephone Encounter (Signed)
Spoke with the pt  He states that on 08/02/13 [facility] to [facility]  They did not provide him with a loaner since he would have to pay for this service  He has not heard back from them about the status of his machine   LMTCB for Melissa to check on this

## 2013-08-08 NOTE — Telephone Encounter (Signed)
[phone] returning a call

## 2013-08-15 ENCOUNTER — Emergency Department (HOSPITAL_COMMUNITY)
Admission: EM | Admit: 2013-08-15 | Discharge: 2013-08-16 | Disposition: A | Discharge status: Home / Self Care | Payer: BC Managed Care – PPO | Attending: Emergency Medicine | Admitting: Emergency Medicine

## 2013-08-15 ENCOUNTER — Encounter (HOSPITAL_COMMUNITY): Payer: Self-pay | Admitting: Emergency Medicine

## 2013-08-15 DIAGNOSIS — L509 Urticaria, unspecified: Secondary | ICD-10-CM | POA: Insufficient documentation

## 2013-08-15 DIAGNOSIS — T4995XA Adverse effect of unspecified topical agent, initial encounter: Secondary | ICD-10-CM | POA: Insufficient documentation

## 2013-08-15 DIAGNOSIS — Z79899 Other long term (current) drug therapy: Secondary | ICD-10-CM | POA: Insufficient documentation

## 2013-08-15 DIAGNOSIS — Z86718 Personal history of other venous thrombosis and embolism: Secondary | ICD-10-CM | POA: Insufficient documentation

## 2013-08-15 DIAGNOSIS — G4733 Obstructive sleep apnea (adult) (pediatric): Secondary | ICD-10-CM | POA: Insufficient documentation

## 2013-08-15 DIAGNOSIS — Z8659 Personal history of other mental and behavioral disorders: Secondary | ICD-10-CM | POA: Insufficient documentation

## 2013-08-15 DIAGNOSIS — T7840XA Allergy, unspecified, initial encounter: Secondary | ICD-10-CM

## 2013-08-15 DIAGNOSIS — K219 Gastro-esophageal reflux disease without esophagitis: Secondary | ICD-10-CM | POA: Insufficient documentation

## 2013-08-15 MED ORDER — DIPHENHYDRAMINE HCL 25 MG PO CAPS
25.0000 mg | ORAL_CAPSULE | Freq: Once | ORAL | Status: AC
  Administered 2013-08-16: 25 mg via ORAL
  Filled 2013-08-15: qty 1

## 2013-08-15 NOTE — ED Notes (Signed)
Patient states he was cutting grass, states he noticed welts and burning sensation to his arms, chest, and back. Patient took 1 pill of Benadryl PTA. Patient states he feels like his symptoms are resolving.

## 2013-08-15 NOTE — ED Provider Notes (Signed)
CSN: [id number]     Arrival date & time 08/15/13  2208 History   First MD Initiated Contact with Patient 08/15/13 2331     Chief Complaint  Patient presents with  . Allergic Reaction    unknown     (Consider location/radiation/quality/duration/timing/severity/associated sxs/prior Treatment) HPI Comments: Patient presents emergency department with chief complaint of allergic reaction. He states that when he was cutting the grass this evening, he began to notice some hives on his chest and arms. He also reports a burning sensation associated with the hives. Patient states that he stopped cutting the grass, went indoors, and took one Benadryl. He states that his symptoms have improved significantly. He denies any shortness of breath, or throat tightening now or at onset of the hives. Currently, he denies any symptoms.  The history is provided by the patient. No language interpreter was used.    Past Medical History  Diagnosis Date  . Allergic rhinitis   . GERD (gastroesophageal reflux disease)   . OSA on CPAP   . DVT (deep venous thrombosis)     RLE 2002 aprox, (-) anticoag panel  . Palpitations     on betablokers  . Anxiety    Past Surgical History  Procedure Laterality Date  . Tonsillectomy    . Nasal septoplasty w/ turbinoplasty     Family History  Problem Relation Age of Onset  . Diabetes Neg Hx   . Heart attack Neg Hx   . Stroke Neg Hx   . Colon cancer Father [id number]    alive  . Prostate cancer Father [id number]   History  Substance Use Topics  . Smoking status: Never Smoker   . Smokeless tobacco: Never Used  . Alcohol Use: Yes     Comment: rare    Review of Systems  All other systems reviewed and are negative.     Allergies  Review of patient's allergies indicates no known allergies.  Home Medications   Prior to Admission medications   Medication Sig Start Date End Date Taking? Authorizing Provider  Aspirin-Acetaminophen-Caffeine (GOODY HEADACHE PO) Take 1 packet  by mouth daily as needed (for pain).   Yes Historical Provider, MD  diphenhydrAMINE (BENADRYL) 25 MG tablet Take 1 tablet (25 mg total) by mouth every 6 (six) hours. 01/28/13  Yes Fayrene HelperBowie Tran, PA-C  esomeprazole (NEXIUM) 40 MG capsule take 1 capsule by mouth once daily BEFORE BREAKFAST 07/08/13  Yes Wanda PlumpJose E Paz, MD  metoprolol succinate (TOPROL-XL) 25 MG 24 hr tablet Take 25 mg by mouth daily.     Yes Historical Provider, MD  Multiple Vitamin (MULTIVITAMIN WITH MINERALS) TABS tablet Take 1 tablet by mouth daily.   Yes Historical Provider, MD   BP 118/54  Pulse 72  Temp(Src) 97.9 F (36.6 C) (Oral)  Resp 12  Ht 6\' 3"  (1.905 m)  Wt 220 lb (99.791 kg)  BMI 27.50 kg/m2  SpO2 98% Physical Exam  Nursing note and vitals reviewed. Constitutional: He is oriented to person, place, and time. He appears well-developed and well-nourished.  HENT:  Head: Normocephalic and atraumatic.  Oropharynx is clear, airway is intact  Eyes: Conjunctivae and EOM are normal. Pupils are equal, round, and reactive to light. Right eye exhibits no discharge. Left eye exhibits no discharge. No scleral icterus.  Neck: Normal range of motion. Neck supple. No JVD present.  Cardiovascular: Normal rate, regular rhythm and normal heart sounds.  Exam reveals no gallop and no friction rub.   No murmur heard. Pulmonary/Chest: Effort  normal and breath sounds normal. No respiratory distress. He has no wheezes. He has no rales. He exhibits no tenderness.  Clear to auscultation bilaterally  Abdominal: Soft. He exhibits no distension and no mass. There is no tenderness. There is no rebound and no guarding.  Musculoskeletal: Normal range of motion. He exhibits no edema and no tenderness.  Neurological: He is alert and oriented to person, place, and time.  Skin: Skin is warm and dry.  Psychiatric: He has a normal mood and affect. His behavior is normal. Judgment and thought content normal.    ED Course  Procedures (including critical  care time) Labs Review Labs Reviewed - No data to display  Imaging Review No results found.   EKG Interpretation None      MDM   Final diagnoses:  Allergic reaction, initial encounter    Patient was allergic reaction while cutting grass. Patient is significantly improved with Benadryl. No evidence of anaphylaxis. Patient has been observed in the emergency department for almost 4 hours. Will discharge to home with instructions to followup with his allergist. Patient is stable and ready for discharge.    Roxy Horsemanobert Browning, PA-C 08/16/13 [phone]

## 2013-08-15 NOTE — ED Notes (Signed)
Pt states that he was mowing the yard this pm and began to feel "burning" to his neck, back and creases of his arms; pt states that he came inside and took some Benadryl; pt reports that the patches have improved since arrival to the ER; pt denies difficulty breathing currently or at any point since this began; pt with some red raised splotchy rash to neck area; pt with small red rash to abd, back and arms.

## 2013-08-16 NOTE — ED Provider Notes (Signed)
Medical screening examination/treatment/procedure(s) were performed by non-physician practitioner and as supervising physician I was immediately available for consultation/collaboration.   EKG Interpretation None       Olivia Mackielga M Otter, MD 08/16/13 [phone]

## 2013-08-16 NOTE — Discharge Instructions (Signed)
Continue benadryl through tomorrow.  Please follow-up with your allergist.  Hives Hives are itchy, red, swollen areas of the skin. They can vary in size and location on your body. Hives can come and go for hours or several days (acute hives) or for several weeks (chronic hives). Hives do not spread from person to person (noncontagious). They may get worse with scratching, exercise, and emotional stress. CAUSES   Allergic reaction to food, additives, or drugs.  Infections, including the common cold.  Illness, such as vasculitis, lupus, or thyroid disease.  Exposure to sunlight, heat, or cold.  Exercise.  Stress.  Contact with chemicals. SYMPTOMS   Red or white swollen patches on the skin. The patches may change size, shape, and location quickly and repeatedly.  Itching.  Swelling of the hands, feet, and face. This may occur if hives develop deeper in the skin. DIAGNOSIS  Your caregiver can usually tell what is wrong by performing a physical exam. Skin or blood tests may also be done to determine the cause of your hives. In some cases, the cause cannot be determined. TREATMENT  Mild cases usually get better with medicines such as antihistamines. Severe cases may require an emergency epinephrine injection. If the cause of your hives is known, treatment includes avoiding that trigger.  HOME CARE INSTRUCTIONS   Avoid causes that trigger your hives.  Take antihistamines as directed by your caregiver to reduce the severity of your hives. Non-sedating or low-sedating antihistamines are usually recommended. Do not drive while taking an antihistamine.  Take any other medicines prescribed for itching as directed by your caregiver.  Wear loose-fitting clothing.  Keep all follow-up appointments as directed by your caregiver. SEEK MEDICAL CARE IF:   You have persistent or severe itching that is not relieved with medicine.  You have painful or swollen joints. SEEK IMMEDIATE MEDICAL  CARE IF:   You have a fever.  Your tongue or lips are swollen.  You have trouble breathing or swallowing.  You feel tightness in the throat or chest.  You have abdominal pain. These problems may be the first sign of a life-threatening allergic reaction. Call your local emergency services (911 in [place] MAKE SURE YOU:   Understand these instructions.  Will watch your condition.  Will get help right away if you are not doing well or get worse. Document Released: 02/03/2005 Document Revised: 02/08/2013 Document Reviewed: 04/29/2011 [facility] Patient Information 2015 [employer] [place]. This information is not intended to replace advice given to you by your health care provider. Make sure you discuss any questions you have with your health care provider.

## 2013-11-30 ENCOUNTER — Telehealth: Payer: Self-pay | Admitting: Pulmonary Disease

## 2013-11-30 NOTE — Telephone Encounter (Signed)
Called spoke with pt. He reports we ordered a new machine back in June 2015. [facility] advised him it would cost $400 to get his CPAP repaired. Pt chose not to get it repaired. Pt reports he has had his CPAP since 2010 and it was repaired in 2012. Pt is wanting a new machine. [facility] to see what can be done. Will await response.

## 2013-12-01 NOTE — Telephone Encounter (Signed)
Melissa calling back.  [phone]

## 2013-12-01 NOTE — Telephone Encounter (Signed)
This mumbo jumbo makes no sense to me.  Please clarify.  In a nutshell, what is being done to take care of the pt to make sure he has a functioning machine??

## 2013-12-01 NOTE — Telephone Encounter (Signed)
Called and spoke with Melissa from [facility]---this is what she stated:  They offered to repair the S8 for the pt but he would have to pay out of pocket $400 and the pt declined. They offered the pt to order a new machine but he had to sign the waiver of liability--but the pt declined this as well.   Pt has an old machine--the S7 that he has been using and pt preferred this machine over the newer one.  [facility] has not serviced this machine for the pt.  Pt is not eligible for a new machine until January of 2017.  [facility] make [facility] aware.

## 2013-12-01 NOTE — Telephone Encounter (Signed)
LMTC x 1 with Melissa with [facility].

## 2013-12-01 NOTE — Telephone Encounter (Signed)
Melissa w/ [facility] returned call & can be reached at [phone].   Holly D Pryor ° °

## 2013-12-01 NOTE — Telephone Encounter (Signed)
[facility]  Pt is wanting to new CPAP machine. Pt currently has an old unit (S7) and has a new machine [employer](S8 Elite given in 2010) Pt is having issues with machine, unit was sent for repair -- cost of repairs for S8 Elite is $400 out of pocket, pt declined repair [employer] will not cover a new machine unless current machine is deemed un-repairable. Machine will continue to be repaired until January 2017. [facility] offered to file claim for new machine is the patient would sign a waiver of liability-- pt is not wanting to do this at this time.  Pt currently using S7 CPAP.   [facility] to [facility] as [facility].

## 2013-12-02 NOTE — Telephone Encounter (Signed)
There is nothing I can do at this point.  He just needs to make a decision about his device.

## 2013-12-02 NOTE — Telephone Encounter (Signed)
lmtcb

## 2013-12-06 NOTE — Telephone Encounter (Signed)
Pt [facility] that [facility] and [facility] are giving him a refurbished S8 CPAP machine.  [facility] for [facility].  Please advise if you are okay with an order being placed for fixed pressure of 10cm with refurbished machine as AUTO setting is not available with this machine.  10cm is the current pressure of his S8 Elite that is needing repair. [facility] and verified this information is correct. Please advise. Thanks.

## 2013-12-07 NOTE — Telephone Encounter (Signed)
Libby, please check into this for me with [facility]

## 2013-12-08 NOTE — Telephone Encounter (Signed)
i spoke with [email or web address]  [employer]#[phone], [employer]bs will not pay for repairs but will replace cpap if dr has documentation that machine is broken and it is deemed medically  necessary for replacement cpap then pt of course has to meet $200 deductable and 10%, [employer]bs will pay 90% Ronnie [name]

## 2013-12-08 NOTE — Telephone Encounter (Signed)
So, please call dme and let them know this.  They need to document if his machine is broken, and send this to us.  I will be happy to document that his cpap device is medically necessary.  Please let pt know too, and tell dme I want him to get his new machine asap.

## 2013-12-13 NOTE — Telephone Encounter (Signed)
Spoke to [email or web address] ,Efraim Kaufmannmelissa was off, she will take care if this and get back to me Tobe SosSally E Ottinger

## 2013-12-15 NOTE — Telephone Encounter (Signed)
Spoke to [email or web address]  she will follow up on this pt;s cpap issues Tobe SosSally E Ottinger

## 2013-12-16 NOTE — Telephone Encounter (Signed)
[email or web address]  said they figured out a way to get him a new cpap and have bcbs pay without all the red tape all they need is an order from you to get new cpap auto 10 because his current cpap does not have auto capabilities and it shoud get covered Tobe SosSally E Ottinger

## 2013-12-19 ENCOUNTER — Other Ambulatory Visit: Payer: Self-pay | Admitting: Pulmonary Disease

## 2013-12-19 DIAGNOSIS — G4733 Obstructive sleep apnea (adult) (pediatric): Secondary | ICD-10-CM

## 2013-12-19 NOTE — Telephone Encounter (Signed)
So they figured this out once we called the insurance company and found out the DME was wrong and called them on it!  I will send order for new machine.

## 2013-12-21 NOTE — Telephone Encounter (Signed)
Patient returning call.  [phone]

## 2013-12-21 NOTE — Telephone Encounter (Signed)
lmomtcb x1 

## 2013-12-21 NOTE — Telephone Encounter (Signed)
lmomtcb for pt to advised order placed for new machine

## 2013-12-22 NOTE — Telephone Encounter (Signed)
Spoke with pt. [facility]  He verbalized understanding and voiced no further questions or concerns at this time.

## 2014-03-30 ENCOUNTER — Other Ambulatory Visit: Payer: Self-pay | Admitting: Internal Medicine

## 2014-05-08 ENCOUNTER — Telehealth: Payer: Self-pay | Admitting: Internal Medicine

## 2014-05-08 NOTE — Telephone Encounter (Signed)
Please advise 

## 2014-05-08 NOTE — Telephone Encounter (Signed)
Spoke with Pt, informed him of Dr. Drue NovelPaz recommendations. Pt has decided to try Prilosec OTC first.

## 2014-05-08 NOTE — Telephone Encounter (Signed)
Caller name: Ronnie [name] Relation to pt: self Call back number: [phone] Pharmacy: [facility] at [address]  Reason for call:   Patient states that it now cost him $ 143 for nexium for a 30 day supply. He states that he cannot take generic and wants to know if there is another alternative?

## 2014-05-08 NOTE — Telephone Encounter (Signed)
Options are OTC Prilosec 20 mg one before bedtime, OTC Prevacid 15 mg before bedtime. He could   transfer to a less potent medications such as Zantac 300 mg at bedtime. Also good option would be to call his insurance and see what is covered

## 2014-05-11 ENCOUNTER — Encounter: Payer: Self-pay | Admitting: *Deleted

## 2014-05-11 ENCOUNTER — Telehealth: Payer: Self-pay | Admitting: *Deleted

## 2014-05-11 NOTE — Telephone Encounter (Signed)
Pre-Visit Call completed with patient and chart updated.   Pre-Visit Info documented in Specialty Comments under [employer].    

## 2014-05-15 ENCOUNTER — Other Ambulatory Visit: Payer: Self-pay

## 2014-05-15 ENCOUNTER — Encounter: Payer: Self-pay | Admitting: Internal Medicine

## 2014-05-15 ENCOUNTER — Ambulatory Visit (INDEPENDENT_AMBULATORY_CARE_PROVIDER_SITE_OTHER): Payer: BLUE CROSS/BLUE SHIELD | Admitting: Internal Medicine

## 2014-05-15 VITALS — BP 126/78 | HR 69 | Temp 98.1°F | Ht 75.0 in | Wt 224.2 lb

## 2014-05-15 DIAGNOSIS — Z0000 Encounter for general adult medical examination without abnormal findings: Secondary | ICD-10-CM

## 2014-05-15 NOTE — Patient Instructions (Signed)
  Please schedule labs to be done within few days (fasting)   Come back to the office in 1 year   for a physical exam  Please schedule an appointment at the front desk    Come back fasting       

## 2014-05-15 NOTE — Assessment & Plan Note (Addendum)
Td 2015  Cscope 10-08 per pt (2 polyps) cscope 11-2011 , next 5 years (Dr Candelaria StagersMeadoff)  PSAs per Urology, sees them q 6 months  Discussed diet-exercise   Labs   RTC 1 year  Chronic medical problems  GERD-.on OTC nexium, having problems getting good coverage for PPIs by his insurance, recommend to call me if he ever gets his insurance to cover aPPIs. Precautions for GERD discussed

## 2014-05-15 NOTE — Progress Notes (Signed)
Subjective:    Patient ID: Ronnie [name], male    DOB: 12-Nov-1960, 54 y.o.   MRN: [id number]  DOS:  05/15/2014 Type of visit - description : cpx Interval history: In general feeling well, still having issues w/ insurance covering PPIs.   Review of Systems Constitutional: No fever, chills. No unexplained wt changes. No unusual sweats HEENT: No dental problems, ear discharge, facial swelling, voice changes. No eye discharge, redness or intolerance to light Respiratory: No wheezing or difficulty breathing. No cough , mucus production Cardiovascular: No CP, leg swelling or palpitations GI: no nausea, vomiting, diarrhea or abdominal pain.  No blood in the stools. No dysphagia   Endocrine: No polyphagia, polyuria or polydipsia GU: No dysuria, gross hematuria, difficulty urinating. No urinary urgency or frequency. Musculoskeletal: No joint swellings or unusual aches or pains Skin: No change in the color of the skin, palor or rash Allergic, immunologic:  Occasional allergies, well controlled with disease Neurological: No dizziness or syncope. No headaches. No diplopia, slurred speech, motor deficits, facial numbness Hematological: No enlarged lymph nodes, easy bruising or bleeding Psychiatry: No suicidal ideas, hallucinations, behavior problems or confusion. No unusual/severe anxiety or depression.     Past Medical History  Diagnosis Date  . Allergic rhinitis   . GERD (gastroesophageal reflux disease)   . OSA on CPAP   . DVT (deep venous thrombosis)     RLE 2002 aprox, (-) anticoag panel  . Palpitations     on betablokers  . Anxiety     Past Surgical History  Procedure Laterality Date  . Tonsillectomy    . Nasal septoplasty w/ turbinoplasty      History   Social History  . Marital Status: Married    Spouse Name: N/A  . Number of Children: 1  . Years of Education: N/A   Occupational History  . managment- [employer]    Social History Main Topics  . Smoking  status: Never Smoker   . Smokeless tobacco: Never Used  . Alcohol Use: Yes     Comment: rare  . Drug Use: No  . Sexual Activity: Not on file   Other Topics Concern  . Not on file   Social History Narrative   Lives w/ wife   Child born 92000   Lost father 01-2014                 Family History  Problem Relation Age of Onset  . Diabetes Neg Hx   . Heart attack Neg Hx   . Stroke Neg Hx   . Colon cancer Father [id number]    alive  . Prostate cancer Father [id number]       Medication List       This list is accurate as of: 05/15/14  9:56 PM.  Always use your most recent med list.               GOODY HEADACHE PO  Take 1 packet by mouth daily as needed (for pain).     metoprolol succinate 25 MG 24 hr tablet  Commonly known as:  TOPROL-XL  Take 25 mg by mouth daily.     multivitamin with minerals Tabs tablet  Take 1 tablet by mouth daily.     NEXIUM 24HR PO  Take 1 tablet by mouth daily. OTC           Objective:   Physical Exam BP 126/78 mmHg  Pulse 69  Temp(Src) 98.1 F (36.7 C) (Oral)  Ht  (1.905 m)  Wt 224 lb 4 oz (101.719 kg)  BMI 28.03 kg/m2  SpO2 97%  General:   Well developed, well nourished . NAD.  Neck:  Full range of motion. Supple. No  thyromegaly , normal carotid pulse HEENT:  Normocephalic . Face symmetric, atraumatic Lungs:  CTA B Normal respiratory effort, no intercostal retractions, no accessory muscle use. Heart: RRR,  no murmur.  Abdomen:  Not distended, soft, non-tender. No rebound or rigidity. No mass,organomegaly Muscle skeletal: no pretibial edema bilaterally  Skin: Exposed areas without rash. Not pale. Not jaundice Neurologic:  alert & oriented X3.  Speech normal, gait appropriate for age and unassisted Strength symmetric and appropriate for age.  Psych: Cognition and judgment appear intact.  Cooperative with normal attention span and concentration.  Behavior appropriate. No anxious or depressed appearing.         Assessment & Plan:

## 2014-05-15 NOTE — Progress Notes (Signed)
Pre visit review using our clinic review tool, if applicable. No additional management support is needed unless otherwise documented below in the visit note. 

## 2014-05-16 ENCOUNTER — Other Ambulatory Visit (INDEPENDENT_AMBULATORY_CARE_PROVIDER_SITE_OTHER): Payer: BLUE CROSS/BLUE SHIELD

## 2014-05-16 DIAGNOSIS — R7989 Other specified abnormal findings of blood chemistry: Secondary | ICD-10-CM | POA: Diagnosis not present

## 2014-05-16 DIAGNOSIS — Z0000 Encounter for general adult medical examination without abnormal findings: Secondary | ICD-10-CM | POA: Diagnosis not present

## 2014-05-16 LAB — LIPID PANEL
Cholesterol: 148 mg/dL (ref 0–200)
HDL: 34 mg/dL — ABNORMAL LOW (ref >39.00)
NonHDL: 114
Total CHOL/HDL Ratio: 4
Triglycerides: 208 mg/dL — ABNORMAL HIGH (ref 0.0–149.0)
VLDL: 41.6 mg/dL — ABNORMAL HIGH (ref 0.0–40.0)

## 2014-05-16 LAB — CBC WITH DIFFERENTIAL/PLATELET
Basophils Absolute: 0 10*3/uL (ref 0.0–0.1)
Basophils Relative: 0.4 % (ref 0.0–3.0)
Eosinophils Absolute: 0.1 10*3/uL (ref 0.0–0.7)
Eosinophils Relative: 2 % (ref 0.0–5.0)
HCT: 41.5 % (ref 39.0–52.0)
Hemoglobin: 13.7 g/dL (ref 13.0–17.0)
Lymphocytes Relative: 39.3 % (ref 12.0–46.0)
Lymphs Abs: 1.5 10*3/uL (ref 0.7–4.0)
MCHC: 33.1 g/dL (ref 30.0–36.0)
MCV: 91.5 fl (ref 78.0–100.0)
Monocytes Absolute: 0.6 10*3/uL (ref 0.1–1.0)
Monocytes Relative: 15.6 % — ABNORMAL HIGH (ref 3.0–12.0)
Neutro Abs: 1.6 10*3/uL (ref 1.4–7.7)
Neutrophils Relative %: 42.7 % — ABNORMAL LOW (ref 43.0–77.0)
Platelets: 178 10*3/uL (ref 150.0–400.0)
RBC: 4.53 Mil/uL (ref 4.22–5.81)
RDW: 12.6 % (ref 11.5–15.5)
WBC: 3.8 10*3/uL — ABNORMAL LOW (ref 4.0–10.5)

## 2014-05-16 LAB — LDL CHOLESTEROL, DIRECT: Direct LDL: 41 mg/dL

## 2014-05-17 LAB — HIV ANTIBODY (ROUTINE TESTING W REFLEX): HIV 1&2 Ab, 4th Generation: NONREACTIVE

## 2014-05-23 ENCOUNTER — Telehealth: Payer: Self-pay | Admitting: Internal Medicine

## 2014-05-23 NOTE — Telephone Encounter (Signed)
Spoke with Pt, informed Pt of lab results. Informed him we will recheck Triglycerides at next OV. Pt verbalized understanding.

## 2014-05-23 NOTE — Telephone Encounter (Signed)
Caller name: Ethelene Brownsanthony Relation to pt: self Call back number: [phone] Pharmacy:  Reason for call:   Patient has not received letter as of yet and is requesting results.

## 2014-07-28 ENCOUNTER — Encounter (INDEPENDENT_AMBULATORY_CARE_PROVIDER_SITE_OTHER): Payer: Self-pay

## 2014-07-28 ENCOUNTER — Ambulatory Visit (INDEPENDENT_AMBULATORY_CARE_PROVIDER_SITE_OTHER): Payer: BLUE CROSS/BLUE SHIELD | Admitting: Pulmonary Disease

## 2014-07-28 ENCOUNTER — Encounter: Payer: Self-pay | Admitting: Pulmonary Disease

## 2014-07-28 VITALS — BP 122/66 | HR 60 | Temp 97.0°F | Ht 75.0 in | Wt 224.8 lb

## 2014-07-28 DIAGNOSIS — G4733 Obstructive sleep apnea (adult) (pediatric): Secondary | ICD-10-CM

## 2014-07-28 NOTE — Assessment & Plan Note (Signed)
The patient is compliant on his C Pap device by his download, and has good control of his AHI. He is very satisfied with his sleep and alertness during the day. I have asked him to continue on his C Pap, and to keep up with his mask changes and supplies.

## 2014-07-28 NOTE — Progress Notes (Signed)
   Subjective:    Patient ID: Ronnie [name], male    DOB: October 15, 1960, 54 y.o.   MRN: [id number]  HPI The patient comes in today for follow-up of his obstructive sleep apnea. He is wearing C Pap compliantly by his download, with good control of his AHI. He is satisfied with his sleep and daytime alertness, and his weight is stable from the last visit.   Review of Systems  Constitutional: Negative for fever and unexpected weight change.  HENT: Negative for congestion, dental problem, ear pain, nosebleeds, postnasal drip, rhinorrhea, sinus pressure, sneezing, sore throat and trouble swallowing.   Eyes: Negative for redness and itching.  Respiratory: Negative for cough, chest tightness, shortness of breath and wheezing.   Cardiovascular: Negative for palpitations and leg swelling.  Gastrointestinal: Negative for nausea and vomiting.  Genitourinary: Negative for dysuria.  Musculoskeletal: Negative for joint swelling.  Skin: Negative for rash.  Neurological: Negative for headaches.  Hematological: Does not bruise/bleed easily.  Psychiatric/Behavioral: Negative for dysphoric mood. The patient is not nervous/anxious.        Objective:   Physical Exam Overweight male in no acute distress Nose without purulence or discharge noted Neck without lymphadenopathy or thyromegaly No skin breakdown or pressure necrosis from the C Pap mask Lower extremities without edema, no cyanosis Alert and oriented, moves all 4 extremities.       Assessment & Plan:

## 2014-07-28 NOTE — Patient Instructions (Signed)
Continue on cpap, and keep up with mask changes and supplies. Work on weight loss  followup with Dr. Sood in one year.  

## 2014-08-02 ENCOUNTER — Ambulatory Visit: Payer: BC Managed Care – PPO | Admitting: Pulmonary Disease

## 2014-10-13 ENCOUNTER — Emergency Department (HOSPITAL_COMMUNITY)
Admission: EM | Admit: 2014-10-13 | Discharge: 2014-10-13 | Disposition: A | Discharge status: Home / Self Care | Payer: BLUE CROSS/BLUE SHIELD | Attending: Emergency Medicine | Admitting: Emergency Medicine

## 2014-10-13 ENCOUNTER — Encounter (HOSPITAL_COMMUNITY): Payer: Self-pay | Admitting: Emergency Medicine

## 2014-10-13 DIAGNOSIS — G4733 Obstructive sleep apnea (adult) (pediatric): Secondary | ICD-10-CM | POA: Insufficient documentation

## 2014-10-13 DIAGNOSIS — L509 Urticaria, unspecified: Secondary | ICD-10-CM | POA: Diagnosis not present

## 2014-10-13 DIAGNOSIS — K219 Gastro-esophageal reflux disease without esophagitis: Secondary | ICD-10-CM | POA: Insufficient documentation

## 2014-10-13 DIAGNOSIS — Z8659 Personal history of other mental and behavioral disorders: Secondary | ICD-10-CM | POA: Insufficient documentation

## 2014-10-13 DIAGNOSIS — Z86718 Personal history of other venous thrombosis and embolism: Secondary | ICD-10-CM | POA: Insufficient documentation

## 2014-10-13 DIAGNOSIS — Z79899 Other long term (current) drug therapy: Secondary | ICD-10-CM | POA: Insufficient documentation

## 2014-10-13 DIAGNOSIS — R21 Rash and other nonspecific skin eruption: Secondary | ICD-10-CM | POA: Diagnosis present

## 2014-10-13 MED ORDER — FAMOTIDINE 20 MG PO TABS
40.0000 mg | ORAL_TABLET | Freq: Once | ORAL | Status: AC
  Administered 2014-10-13: 40 mg via ORAL
  Filled 2014-10-13: qty 2

## 2014-10-13 MED ORDER — PREDNISONE 20 MG PO TABS: 40.0000 mg | ORAL_TABLET | Freq: Every day | ORAL | Status: DC

## 2014-10-13 MED ORDER — FAMOTIDINE 20 MG PO TABS: 20.0000 mg | ORAL_TABLET | Freq: Two times a day (BID) | ORAL | Status: DC

## 2014-10-13 MED ORDER — PREDNISONE 20 MG PO TABS
60.0000 mg | ORAL_TABLET | Freq: Once | ORAL | Status: AC
  Administered 2014-10-13: 60 mg via ORAL
  Filled 2014-10-13: qty 3

## 2014-10-13 MED ORDER — DIPHENHYDRAMINE HCL 25 MG PO CAPS
25.0000 mg | ORAL_CAPSULE | Freq: Once | ORAL | Status: AC
  Administered 2014-10-13: 25 mg via ORAL
  Filled 2014-10-13: qty 1

## 2014-10-13 NOTE — ED Notes (Signed)
Pt from home c/o rash to arms, abdomen, and back that started 1830. He took benadryl at 1845. Lung sounds clear.

## 2014-10-13 NOTE — Discharge Instructions (Signed)
Hives Hives are itchy, red, swollen areas of the skin. They can vary in size and location on your body. Hives can come and go for hours or several days (acute hives) or for several weeks (chronic hives). Hives do not spread from person to person (noncontagious). They may get worse with scratching, exercise, and emotional stress. CAUSES   Allergic reaction to food, additives, or drugs.  Infections, including the common cold.  Illness, such as vasculitis, lupus, or thyroid disease.  Exposure to sunlight, heat, or cold.  Exercise.  Stress.  Contact with chemicals. SYMPTOMS   Red or white swollen patches on the skin. The patches may change size, shape, and location quickly and repeatedly.  Itching.  Swelling of the hands, feet, and face. This may occur if hives develop deeper in the skin. DIAGNOSIS  Your caregiver can usually tell what is wrong by performing a physical exam. Skin or blood tests may also be done to determine the cause of your hives. In some cases, the cause cannot be determined. TREATMENT  Mild cases usually get better with medicines such as antihistamines. Severe cases may require an emergency epinephrine injection. If the cause of your hives is known, treatment includes avoiding that trigger.  HOME CARE INSTRUCTIONS   Avoid causes that trigger your hives.  Take antihistamines as directed by your caregiver to reduce the severity of your hives. Non-sedating or low-sedating antihistamines are usually recommended. Do not drive while taking an antihistamine.  Take any other medicines prescribed for itching as directed by your caregiver.  Wear loose-fitting clothing.  Keep all follow-up appointments as directed by your caregiver. SEEK MEDICAL CARE IF:   You have persistent or severe itching that is not relieved with medicine.  You have painful or swollen joints. SEEK IMMEDIATE MEDICAL CARE IF:   You have a fever.  Your tongue or lips are swollen.  You have  trouble breathing or swallowing.  You feel tightness in the throat or chest.  You have abdominal pain. These problems may be the first sign of a life-threatening allergic reaction. Call your local emergency services (911 in [place] MAKE SURE YOU:   Understand these instructions.  Will watch your condition.  Will get help right away if you are not doing well or get worse. Document Released: 02/03/2005 Document Revised: 02/08/2013 Document Reviewed: 04/29/2011 ExitCare Patient Information 2015 [employer]. This information is not intended to replace advice given to you by your health care provider. Make sure you discuss any questions you have with your health care provider.  

## 2014-10-13 NOTE — ED Provider Notes (Signed)
CSN: [id number]     Arrival date & time 10/13/14  1933 History  This chart was scribed for non-physician practitioner, Antony Madura, PA-C working with Mancel Bale, MD by Freida Busman, ED Scribe. This patient was seen in room WTR8/WTR8 and the patient's care was started at 9:22 PM.    Chief Complaint  Patient presents with  . Allergic Reaction    The history is provided by the patient. No language interpreter was used.    HPI Comments:  Ronnie [name] is a 54 y.o. male who presents to the Emergency Department complaining of a diffuse rash to his back, BUE and abdomen which he noticed ~1830. Pt notes he was at a gym playing basketball prior to symptom onset. Pt has a history of food allergies (shellfish and peanuts) which he has seen an allergist for, states he has not had an allergic reaction to either in years. He last ate ~1600; he ate pre-packeged chicken and donuts which he has eaten before.He also ate a salad which he has never eaten before. He reports taking benadryl PTA with mild improvement. He denies SOB, lip and tongue swelling. He also denies use of new soaps/lotions/detergents.    Past Medical History  Diagnosis Date  . Allergic rhinitis   . GERD (gastroesophageal reflux disease)   . OSA on CPAP   . DVT (deep venous thrombosis)     RLE 2002 aprox, (-) anticoag panel  . Palpitations     on betablokers  . Anxiety    Past Surgical History  Procedure Laterality Date  . Tonsillectomy    . Nasal septoplasty w/ turbinoplasty     Family History  Problem Relation Age of Onset  . Diabetes Neg Hx   . Heart attack Neg Hx   . Stroke Neg Hx   . Colon cancer Father 25    alive  . Prostate cancer Father 8   Social History  Substance Use Topics  . Smoking status: Never Smoker   . Smokeless tobacco: Never Used  . Alcohol Use: Yes     Comment: rare    Review of Systems  Constitutional: Negative for fever and chills.  HENT: Negative for trouble swallowing.   Respiratory:  Negative for shortness of breath and wheezing.   Cardiovascular: Negative for chest pain.  Skin: Positive for rash.  All other systems reviewed and are negative.   Allergies  Review of patient's allergies indicates no known allergies.  Home Medications   Prior to Admission medications   Medication Sig Start Date End Date Taking? Authorizing Provider  Aspirin-Acetaminophen-Caffeine (GOODY HEADACHE PO) Take 1 packet by mouth daily as needed (for pain).    Historical Provider, MD  Esomeprazole Magnesium (NEXIUM 24HR PO) Take 1 tablet by mouth daily. OTC    Historical Provider, MD  famotidine (PEPCID) 20 MG tablet Take 1 tablet (20 mg total) by mouth 2 (two) times daily. As needed for itching/hives 10/13/14   Antony Madura, PA-C  metoprolol succinate (TOPROL-XL) 25 MG 24 hr tablet Take 25 mg by mouth daily.      Historical Provider, MD  Multiple Vitamin (MULTIVITAMIN WITH MINERALS) TABS tablet Take 1 tablet by mouth daily.    Historical Provider, MD  predniSONE (DELTASONE) 20 MG tablet Take 2 tablets (40 mg total) by mouth daily. 10/13/14   Antony Madura, PA-C   BP 135/81 mmHg  Pulse 64  Temp(Src) 97.9 F (36.6 C) (Oral)  Resp 20  SpO2 99%   Physical Exam  Constitutional: He  is oriented to person, place, and time. He appears well-developed and well-nourished. No distress.  HENT:  Head: Normocephalic and atraumatic.  Mouth/Throat: Oropharynx is clear and moist. No oropharyngeal exudate.  Oropharynx clear. No angioedema. Patient tolerating secretions without difficulty.  Eyes: Conjunctivae and EOM are normal. No scleral icterus.  Neck: Normal range of motion.  No stridor  Cardiovascular: Normal rate, regular rhythm and intact distal pulses.   Pulmonary/Chest: Effort normal and breath sounds normal. No respiratory distress. He has no wheezes. He has no rales.  Respirations even and unlabored  Musculoskeletal: Normal range of motion.  Neurological: He is alert and oriented to person, place,  and time. He exhibits normal muscle tone. Coordination normal.  Skin: Skin is warm and dry. He is not diaphoretic. No erythema. No pallor.  Mild urticaria noted, scattered on her back as well as present on arms to bilateral antecubital fossa. Urticaria is macular, mildly raised and erythematous, and pruritic.  Psychiatric: He has a normal mood and affect. His behavior is normal.  Nursing note and vitals reviewed.   ED Course  Procedures   DIAGNOSTIC STUDIES:  Oxygen Saturation is 99% on RA, normal by my interpretation.    COORDINATION OF CARE:  9:27 PM Will order pepcid, prednisone and benadryl. Discussed treatment plan with pt at bedside and pt agreed to plan.  Labs Review Labs Reviewed - No data to display  Imaging Review No results found.   I have personally reviewed and evaluated these images and lab results as part of my medical decision-making.   EKG Interpretation None      MDM   Final diagnoses:  Urticaria    54 year old male presents to the emergency department for further evaluation of rash. Rash consistent with urticaria. Patient denies any known new food ingestions or topical contacts. No new soaps, lotions, or detergents. Symptoms resolving with Benadryl taken prior to arrival. Have supplemented treatment with additional tablet of Benadryl, Pepcid, and prednisone. Patient has no stridor or respiratory symptoms. No angioedema. No hypoxia. Patient is stable for discharge at this time with instruction of follow-up with his allergist. Return precautions given at discharge. Patient discharged in good condition with no unaddressed concerns.  I personally performed the services described in this documentation, which was scribed in my presence. The recorded information has been reviewed and is accurate.   Filed Vitals:   10/13/14 2017  BP: 135/81  Pulse: 64  Temp: 97.9 F (36.6 C)  TempSrc: Oral  Resp: 20  SpO2: 99%      Antony Madura, PA-C 10/13/14  2236  Mancel Bale, MD 10/13/14 2255

## 2015-01-24 ENCOUNTER — Telehealth: Payer: Self-pay | Admitting: Internal Medicine

## 2015-01-24 NOTE — Telephone Encounter (Signed)
Pt has not been seen 04/2013, will not be able to refill until seen.

## 2015-01-24 NOTE — Telephone Encounter (Signed)
Caller name: Self   Can be reached: [phone]  Pharmacy:  [phone] [address], [place] - [id number] [facility] [address] [facility] [address] [phone] (Phone) [phone] (Fax)        Reason for call: Request refill on metoprolol succinate (TOPROL-XL) 25 MG 24 hr tablet [[id number]][[id number]]

## 2015-01-25 ENCOUNTER — Other Ambulatory Visit: Payer: Self-pay | Admitting: Internal Medicine

## 2015-01-25 NOTE — Telephone Encounter (Signed)
Pt called in bc meds were not sent in. Per below they were declined bc he needs seen. Pt was seen 05/15/14 for cpe and told to f/u in 1 year. Please f/u with pt and/or send in meds.

## 2015-01-25 NOTE — Telephone Encounter (Signed)
Out since Monday

## 2015-01-26 MED ORDER — METOPROLOL SUCCINATE ER 25 MG PO TB24: 25.0000 mg | ORAL_TABLET | Freq: Every day | ORAL | Status: DC

## 2015-01-26 NOTE — Addendum Note (Signed)
Addended byConrad [place]: CANTER, KAYLYN D on: 01/26/2015 08:13 AM   Modules accepted: Orders

## 2015-01-26 NOTE — Telephone Encounter (Signed)
Rx sent 

## 2015-05-17 ENCOUNTER — Telehealth: Payer: Self-pay | Admitting: *Deleted

## 2015-05-17 ENCOUNTER — Encounter: Payer: Self-pay | Admitting: *Deleted

## 2015-05-18 ENCOUNTER — Encounter: Payer: Self-pay | Admitting: Internal Medicine

## 2015-05-18 ENCOUNTER — Ambulatory Visit (INDEPENDENT_AMBULATORY_CARE_PROVIDER_SITE_OTHER): Payer: BLUE CROSS/BLUE SHIELD | Admitting: Internal Medicine

## 2015-05-18 VITALS — BP 108/72 | HR 55 | Temp 97.8°F | Ht 75.0 in | Wt 220.5 lb

## 2015-05-18 DIAGNOSIS — Z0000 Encounter for general adult medical examination without abnormal findings: Secondary | ICD-10-CM | POA: Diagnosis not present

## 2015-05-18 DIAGNOSIS — Z09 Encounter for follow-up examination after completed treatment for conditions other than malignant neoplasm: Secondary | ICD-10-CM

## 2015-05-18 LAB — BASIC METABOLIC PANEL
BUN: 12 mg/dL (ref 6–23)
CO2: 28 mEq/L (ref 19–32)
Calcium: 9.4 mg/dL (ref 8.4–10.5)
Chloride: 104 mEq/L (ref 96–112)
Creatinine, Ser: 0.98 mg/dL (ref 0.40–1.50)
GFR: 103.15 mL/min (ref >60.00)
Glucose, Bld: 91 mg/dL (ref 70–99)
Potassium: 3.8 mEq/L (ref 3.5–5.1)
Sodium: 138 mEq/L (ref 135–145)

## 2015-05-18 LAB — LIPID PANEL
Cholesterol: 100 mg/dL (ref 0–200)
HDL: 37.2 mg/dL — ABNORMAL LOW (ref >39.00)
LDL Cholesterol: 50 mg/dL (ref 0–99)
NonHDL: 62.72
Total CHOL/HDL Ratio: 3
Triglycerides: 66 mg/dL (ref 0.0–149.0)
VLDL: 13.2 mg/dL (ref 0.0–40.0)

## 2015-05-18 LAB — ALT: ALT: 16 U/L (ref 0–53)

## 2015-05-18 LAB — AST: AST: 20 U/L (ref 0–37)

## 2015-05-18 NOTE — Assessment & Plan Note (Addendum)
Td 2015  Cscope 10-08 per pt (2 polyps) cscope 11-2011 , next 5 years (Dr Candelaria StagersMeadoff)  PSAs per Urology, sees them yearly  Discussed diet-exercise (doing well) Labs : BMP, LFTs, FLP  RTC 1 year

## 2015-05-18 NOTE — Progress Notes (Signed)
Subjective:    Patient ID: Ronnie [name], male    DOB: 07/07/1960, 55 y.o.   MRN: [id number]  DOS:  05/18/2015 Type of visit - description : CPX  Interval history:  In general feeling well, eating healthier, has lost a few pounds, he remains active Somewhat concerned about daily use of PPIs  Review of Systems Constitutional: No fever. No chills. No unexplained wt changes. No unusual sweats  HEENT: No dental problems, no ear discharge, no facial swelling, no voice changes. No eye discharge, no eye  redness , no  intolerance to light   Respiratory: No wheezing , no  difficulty breathing. No cough , no mucus production  Cardiovascular: No CP, no leg swelling , no  Palpitations  GI: no nausea, no vomiting, no diarrhea , no  abdominal pain.  No blood in the stools. No dysphagia, no odynophagia    Endocrine: No polyphagia, no polyuria , no polydipsia  GU: No dysuria, gross hematuria, difficulty urinating. No urinary urgency, no frequency.  Musculoskeletal: No joint swellings or unusual aches or pains  Skin: No change in the color of the skin, palor , no  Rash  Allergic, immunologic: Under the care off Dr. Sombrillo Callas, on weekly injections  Neurological: No dizziness no  syncope. No headaches. No diplopia, no slurred, no slurred speech, no motor deficits, no facial  Numbness  Hematological: No enlarged lymph nodes, no easy bruising , no unusual bleedings  Psychiatry: No suicidal ideas, no hallucinations, no beavior problems, no confusion.  No unusual/severe anxiety, no depression   Past Medical History  Diagnosis Date  . Allergic rhinitis   . GERD (gastroesophageal reflux disease)   . OSA on CPAP   . DVT (deep venous thrombosis) (HCC)     RLE 2002 aprox, (-) anticoag panel  . Palpitations     on betablokers  . Anxiety     Past Surgical History  Procedure Laterality Date  . Tonsillectomy    . Nasal septoplasty w/ turbinoplasty      Social History   Social History    . Marital Status: Married    Spouse Name: N/A  . Number of Children: 1  . Years of Education: N/A   Occupational History  . managment- [employer]    Social History Main Topics  . Smoking status: Never Smoker   . Smokeless tobacco: Never Used  . Alcohol Use: Yes     Comment: rare  . Drug Use: No  . Sexual Activity: Not on file   Other Topics Concern  . Not on file   Social History Narrative   Lives w/ wife   Child born 80   Lost father 01-2014                 Family History  Problem Relation Age of Onset  . Diabetes Neg Hx   . Heart attack Neg Hx   . Stroke Neg Hx   . Colon cancer Father 29       . Prostate cancer Father 84      Medication List       This list is accurate as of: 05/18/15  5:59 PM.  Always use your most recent med list.               GOODY HEADACHE PO  Take 1 packet by mouth daily as needed (for pain).     metoprolol succinate 25 MG 24 hr tablet  Commonly known as:  TOPROL-XL  Take  1 tablet (25 mg total) by mouth daily.     MULTIVITAMIN PO  Take 1 tablet by mouth daily.     NEXIUM 24HR PO  Take 1 tablet by mouth daily. OTC     NON FORMULARY  Inject 1 Dose as directed once a week. Allergy Shot           Objective:   Physical Exam BP 108/72 mmHg  Pulse 55  Temp(Src) 97.8 F (36.6 C) (Oral)  Ht 6\' 3"  (1.905 m)  Wt 220 lb 8 oz (100.018 kg)  BMI 27.56 kg/m2  SpO2 97%  General:   Well developed, well nourished . NAD.  Neck: No  thyromegaly   HEENT:  Normocephalic . Face symmetric, atraumatic Lungs:  CTA B Normal respiratory effort, no intercostal retractions, no accessory muscle use. Heart: RRR,  no murmur.  No pretibial edema bilaterally  Abdomen:  Not distended, soft, non-tender. No rebound or rigidity.   Skin: Exposed areas without rash. Not pale. Not jaundice Neurologic:  alert & oriented X3.  Speech normal, gait appropriate for age and unassisted Strength symmetric and appropriate for age.   Psych: Cognition and judgment appear intact.  Cooperative with normal attention span and concentration.  Behavior appropriate. No anxious or depressed appearing.    Assessment & Plan:   Assessment Anxiety  Palpitations on bb most day OSA, on CPAP GERD Allergic rhinitis, weekly shots, Dr [place] CallasSharma DVT, RLE 2002 , (-)  coagulation panel  PLAN: Here for a CPX. Doing well GERD: Somewhat concerned about long-term effects of Nexium 20 mg daily, we discussed that issue, other strategies to help heartburn include avoidance of triggers, taking PPIs 3 times a week, change to Zantac. At this point he elected to continue Nexium daily. Other medical problems stable. RTC one year

## 2015-05-18 NOTE — Telephone Encounter (Addendum)
Pre-Visit Call completed with patient and chart updated.   Pre-Visit Info documented in Specialty Comments under SnapShot.    

## 2015-05-18 NOTE — Progress Notes (Signed)
Pre visit review using our clinic review tool, if applicable. No additional management support is needed unless otherwise documented below in the visit note. 

## 2015-05-18 NOTE — Patient Instructions (Addendum)
GO TO THE LAB :      Get the blood work     GO TO THE FRONT DESK Schedule your next appointment for a  physical When?   1 year Fasting?  Yes     Food Choices for Gastroesophageal Reflux Disease, Adult When you have gastroesophageal reflux disease (GERD), the foods you eat and your eating habits are very important. Choosing the right foods can help ease the discomfort of GERD. WHAT GENERAL GUIDELINES DO I NEED TO FOLLOW?  Choose fruits, vegetables, whole grains, low-fat dairy products, and low-fat meat, fish, and poultry.  Limit fats such as oils, salad dressings, butter, nuts, and avocado.  Keep a food diary to identify foods that cause symptoms.  Avoid foods that cause reflux. These may be different for different people.  Eat frequent small meals instead of three large meals each day.  Eat your meals slowly, in a relaxed setting.  Limit fried foods.  Cook foods using methods other than frying.  Avoid drinking alcohol.  Avoid drinking large amounts of liquids with your meals.  Avoid bending over or lying down until 2-3 hours after eating. WHAT FOODS ARE NOT RECOMMENDED? The following are some foods and drinks that may worsen your symptoms: Vegetables Tomatoes. Tomato juice. Tomato and spaghetti sauce. Chili peppers. Onion and garlic. Horseradish. Fruits Oranges, grapefruit, and lemon (fruit and juice). Meats High-fat meats, fish, and poultry. This includes hot dogs, ribs, ham, sausage, salami, and bacon. Dairy Whole milk and chocolate milk. Sour cream. Cream. Butter. Ice cream. Cream cheese.  Beverages Coffee and tea, with or without caffeine. Carbonated beverages or energy drinks. Condiments Hot sauce. Barbecue sauce.  Sweets/Desserts Chocolate and cocoa. Donuts. Peppermint and spearmint. Fats and Oils High-fat foods, including [place] fries and potato chips. Other Vinegar. Strong spices, such as black pepper, white pepper, red pepper, cayenne, curry powder,  cloves, ginger, and chili powder. The items listed above may not be a complete list of foods and beverages to avoid. Contact your dietitian for more information.   This information is not intended to replace advice given to you by your health care provider. Make sure you discuss any questions you have with your health care provider.   Document Released: 02/03/2005 Document Revised: 02/24/2014 Document Reviewed: 12/08/2012 Elsevier Interactive Patient Education [employer]2016 Elsevier Inc.

## 2015-05-18 NOTE — Assessment & Plan Note (Signed)
Here for a CPX. Doing well GERD: Somewhat concerned about long-term effects of Nexium 20 mg daily, we discussed that issue, other strategies to help heartburn include avoidance of triggers, taking PPIs 3 times a week, change to Zantac. At this point he elected to continue Nexium daily. Other medical problems stable. RTC one year

## 2015-08-03 ENCOUNTER — Ambulatory Visit: Payer: BLUE CROSS/BLUE SHIELD | Admitting: Pulmonary Disease

## 2015-08-10 ENCOUNTER — Ambulatory Visit (INDEPENDENT_AMBULATORY_CARE_PROVIDER_SITE_OTHER): Payer: BLUE CROSS/BLUE SHIELD | Admitting: Pulmonary Disease

## 2015-08-10 ENCOUNTER — Encounter: Payer: Self-pay | Admitting: Pulmonary Disease

## 2015-08-10 VITALS — BP 104/70 | HR 69 | Ht 74.5 in | Wt 227.4 lb

## 2015-08-10 DIAGNOSIS — Z9989 Dependence on other enabling machines and devices: Principal | ICD-10-CM

## 2015-08-10 DIAGNOSIS — G4733 Obstructive sleep apnea (adult) (pediatric): Secondary | ICD-10-CM

## 2015-08-10 NOTE — Patient Instructions (Signed)
Will have [facility] adjust auto CPAP settings range  Follow up in 1 year

## 2015-08-10 NOTE — Progress Notes (Signed)
Current Outpatient Prescriptions on File Prior to Visit  Medication Sig  . Aspirin-Acetaminophen-Caffeine (GOODY HEADACHE PO) Take 1 packet by mouth daily as needed (for pain).  . metoprolol succinate (TOPROL-XL) 25 MG 24 hr tablet Take 1 tablet (25 mg total) by mouth daily.  . Multiple Vitamins-Minerals (MULTIVITAMIN PO) Take 1 tablet by mouth daily.  . NON FORMULARY Inject 1 Dose as directed once a week. Allergy Shot   No current facility-administered medications on file prior to visit.     Chief Complaint  Patient presents with  . Follow-up    Former [facility] pt: Wears CPAP nightly. Denies problems with mask/pressure. Trouble getting all supplies based on insurance coverage. DME: [facility]     Tests PSG 04/10/03 >> AHI 43 Auto CPAP 07/10/15 to 08/08/15 >> used on 29 of 30 nights with average 4 hrs 48 min.  Average AHI 2.7 with median CPAP 7 and 95 th percentile CPAP 10 cm H2O  Past medical hx GERD, DVT 2002, Anxiety, Palpitations  Past surgical hx, Allergies, Family hx, Social hx all reviewed.  Vital Signs BP 104/70 mmHg  Pulse 69  Ht 6' 2.5" (1.892 m)  Wt 227 lb 6.4 oz (103.148 kg)  BMI 28.81 kg/m2  SpO2 96%  History of Present Illness Ronnie [name] is a 55 y.o. male with obstructive sleep apnea.  He has full face mask.  CPAP helps. Feels rested.  Main concern is expense of supplies.  Physical Exam  General - No distress ENT - No sinus tenderness, no oral exudate, no LAN, MP 3, enlarged tongue Cardiac - s1s2 regular, no murmur Chest - No wheeze/rales/dullness Back - No focal tenderness Abd - Soft, non-tender Ext - No edema Neuro - Normal strength Skin - No rashes Psych - normal mood, and behavior   Assessment/Plan  Obstructive sleep apnea. - will change to auto CPAP range 5 to 12 cm H2O - advised him to check on line to get CPAP supplies at less expensive rate compared to going through DME >> he will call if he needs script for this   Patient Instructions  Will  have [facility] adjust auto CPAP settings range  Follow up in 1 year    Coralyn HellingVineet Sood, MD [place] [facility] Pager:  [phone] 08/10/2015, 3:24 PM

## 2015-08-29 ENCOUNTER — Other Ambulatory Visit: Payer: Self-pay | Admitting: Internal Medicine

## 2015-09-07 DIAGNOSIS — J3089 Other allergic rhinitis: Secondary | ICD-10-CM | POA: Diagnosis not present

## 2015-09-07 DIAGNOSIS — J301 Allergic rhinitis due to pollen: Secondary | ICD-10-CM | POA: Diagnosis not present

## 2015-12-24 DIAGNOSIS — J3089 Other allergic rhinitis: Secondary | ICD-10-CM | POA: Diagnosis not present

## 2015-12-24 DIAGNOSIS — J301 Allergic rhinitis due to pollen: Secondary | ICD-10-CM | POA: Diagnosis not present

## 2016-01-03 DIAGNOSIS — J301 Allergic rhinitis due to pollen: Secondary | ICD-10-CM | POA: Diagnosis not present

## 2016-01-03 DIAGNOSIS — J3089 Other allergic rhinitis: Secondary | ICD-10-CM | POA: Diagnosis not present

## 2016-01-16 DIAGNOSIS — J3089 Other allergic rhinitis: Secondary | ICD-10-CM | POA: Diagnosis not present

## 2016-01-16 DIAGNOSIS — J301 Allergic rhinitis due to pollen: Secondary | ICD-10-CM | POA: Diagnosis not present

## 2016-01-28 DIAGNOSIS — J3089 Other allergic rhinitis: Secondary | ICD-10-CM | POA: Diagnosis not present

## 2016-01-28 DIAGNOSIS — J301 Allergic rhinitis due to pollen: Secondary | ICD-10-CM | POA: Diagnosis not present

## 2016-02-08 DIAGNOSIS — J301 Allergic rhinitis due to pollen: Secondary | ICD-10-CM | POA: Diagnosis not present

## 2016-02-08 DIAGNOSIS — J3089 Other allergic rhinitis: Secondary | ICD-10-CM | POA: Diagnosis not present

## 2016-02-25 DIAGNOSIS — J3089 Other allergic rhinitis: Secondary | ICD-10-CM | POA: Diagnosis not present

## 2016-02-25 DIAGNOSIS — J301 Allergic rhinitis due to pollen: Secondary | ICD-10-CM | POA: Diagnosis not present

## 2016-03-17 DIAGNOSIS — J3089 Other allergic rhinitis: Secondary | ICD-10-CM | POA: Diagnosis not present

## 2016-03-17 DIAGNOSIS — J301 Allergic rhinitis due to pollen: Secondary | ICD-10-CM | POA: Diagnosis not present

## 2016-03-25 DIAGNOSIS — J301 Allergic rhinitis due to pollen: Secondary | ICD-10-CM | POA: Diagnosis not present

## 2016-03-25 DIAGNOSIS — J3089 Other allergic rhinitis: Secondary | ICD-10-CM | POA: Diagnosis not present

## 2016-04-03 DIAGNOSIS — J3089 Other allergic rhinitis: Secondary | ICD-10-CM | POA: Diagnosis not present

## 2016-04-03 DIAGNOSIS — J301 Allergic rhinitis due to pollen: Secondary | ICD-10-CM | POA: Diagnosis not present

## 2016-04-14 DIAGNOSIS — J3089 Other allergic rhinitis: Secondary | ICD-10-CM | POA: Diagnosis not present

## 2016-04-14 DIAGNOSIS — J301 Allergic rhinitis due to pollen: Secondary | ICD-10-CM | POA: Diagnosis not present

## 2016-04-21 DIAGNOSIS — J3089 Other allergic rhinitis: Secondary | ICD-10-CM | POA: Diagnosis not present

## 2016-04-21 DIAGNOSIS — J301 Allergic rhinitis due to pollen: Secondary | ICD-10-CM | POA: Diagnosis not present

## 2016-05-02 DIAGNOSIS — J301 Allergic rhinitis due to pollen: Secondary | ICD-10-CM | POA: Diagnosis not present

## 2016-05-02 DIAGNOSIS — J3089 Other allergic rhinitis: Secondary | ICD-10-CM | POA: Diagnosis not present

## 2016-05-09 DIAGNOSIS — J301 Allergic rhinitis due to pollen: Secondary | ICD-10-CM | POA: Diagnosis not present

## 2016-05-09 DIAGNOSIS — J3089 Other allergic rhinitis: Secondary | ICD-10-CM | POA: Diagnosis not present

## 2016-05-14 DIAGNOSIS — J3089 Other allergic rhinitis: Secondary | ICD-10-CM | POA: Diagnosis not present

## 2016-05-14 DIAGNOSIS — J301 Allergic rhinitis due to pollen: Secondary | ICD-10-CM | POA: Diagnosis not present

## 2016-05-22 DIAGNOSIS — J3089 Other allergic rhinitis: Secondary | ICD-10-CM | POA: Diagnosis not present

## 2016-05-22 DIAGNOSIS — J301 Allergic rhinitis due to pollen: Secondary | ICD-10-CM | POA: Diagnosis not present

## 2016-05-23 ENCOUNTER — Encounter: Payer: Self-pay | Admitting: Internal Medicine

## 2016-05-23 ENCOUNTER — Ambulatory Visit (INDEPENDENT_AMBULATORY_CARE_PROVIDER_SITE_OTHER): Payer: BLUE CROSS/BLUE SHIELD | Admitting: Internal Medicine

## 2016-05-23 VITALS — BP 122/68 | HR 58 | Temp 97.6°F | Resp 14 | Ht 75.0 in | Wt 225.2 lb

## 2016-05-23 DIAGNOSIS — Z0000 Encounter for general adult medical examination without abnormal findings: Secondary | ICD-10-CM

## 2016-05-23 LAB — CBC WITH DIFFERENTIAL/PLATELET
Basophils Absolute: 0 10*3/uL (ref 0.0–0.1)
Basophils Relative: 0.4 % (ref 0.0–3.0)
Eosinophils Absolute: 0 10*3/uL (ref 0.0–0.7)
Eosinophils Relative: 1.6 % (ref 0.0–5.0)
HCT: 39.9 % (ref 39.0–52.0)
Hemoglobin: 13 g/dL (ref 13.0–17.0)
Lymphocytes Relative: 32.7 % (ref 12.0–46.0)
Lymphs Abs: 1 10*3/uL (ref 0.7–4.0)
MCHC: 32.6 g/dL (ref 30.0–36.0)
MCV: 92.8 fl (ref 78.0–100.0)
Monocytes Absolute: 0.4 10*3/uL (ref 0.1–1.0)
Monocytes Relative: 12.5 % — ABNORMAL HIGH (ref 3.0–12.0)
Neutro Abs: 1.6 10*3/uL (ref 1.4–7.7)
Neutrophils Relative %: 52.8 % (ref 43.0–77.0)
Platelets: 160 10*3/uL (ref 150.0–400.0)
RBC: 4.3 Mil/uL (ref 4.22–5.81)
RDW: 12.7 % (ref 11.5–15.5)
WBC: 3.1 10*3/uL — ABNORMAL LOW (ref 4.0–10.5)

## 2016-05-23 LAB — LIPID PANEL
Cholesterol: 102 mg/dL (ref 0–200)
HDL: 36.4 mg/dL — ABNORMAL LOW (ref >39.00)
LDL Cholesterol: 51 mg/dL (ref 0–99)
NonHDL: 65.92
Total CHOL/HDL Ratio: 3
Triglycerides: 74 mg/dL (ref 0.0–149.0)
VLDL: 14.8 mg/dL (ref 0.0–40.0)

## 2016-05-23 LAB — BASIC METABOLIC PANEL
BUN: 10 mg/dL (ref 6–23)
CO2: 27 mEq/L (ref 19–32)
Calcium: 9.1 mg/dL (ref 8.4–10.5)
Chloride: 107 mEq/L (ref 96–112)
Creatinine, Ser: 0.91 mg/dL (ref 0.40–1.50)
GFR: 111.92 mL/min (ref >60.00)
Glucose, Bld: 92 mg/dL (ref 70–99)
Potassium: 3.8 mEq/L (ref 3.5–5.1)
Sodium: 140 mEq/L (ref 135–145)

## 2016-05-23 LAB — TSH: TSH: 1.5 u[IU]/mL (ref 0.35–4.50)

## 2016-05-23 NOTE — Progress Notes (Signed)
Pre visit review using our clinic review tool, if applicable. No additional management support is needed unless otherwise documented below in the visit note. 

## 2016-05-23 NOTE — Assessment & Plan Note (Addendum)
--  Td 2015 --CCS: +FH Cscope 10-08 per pt (2 polyps) cscope 11-2011 , next 5 years (Dr Candelaria Stagers) --[facility]As at [facility]  Urology per pt, sees them yearly, no documentation  --Discussed diet-exercise  --labs : BMP, FLP, CBC, TSH, EKG (NSR,no acute changes) RTC 1 year

## 2016-05-23 NOTE — Progress Notes (Signed)
Subjective:    Patient ID: Ronnie [name], male    DOB: [birth year], 56 y.o.   MRN: [id number]  DOS:  05/23/2016 Type of visit - description : cpx Interval history: No major concerns   Review of Systems  Has occasionally back pain, more on the right side, usually associated to standing for long periods of time and squatting. Ice heat help. No radiation, no lower extremity paresthesias. Occasionally has elbow pain, inner aspect, usually after he carries heavy stuff at work.   Other than above, a 14 point review of systems is negative     Past Medical History:  Diagnosis Date  . Allergic rhinitis   . Anxiety   . DVT (deep venous thrombosis) (HCC)    RLE 2002 aprox, (-) anticoag panel  . GERD (gastroesophageal reflux disease)   . OSA on CPAP   . Palpitations    on betablokers    Past Surgical History:  Procedure Laterality Date  . NASAL SEPTOPLASTY W/ TURBINOPLASTY    . TONSILLECTOMY      Social History   Social History  . Marital status: Married    Spouse name: N/A  . Number of children: 1  . Years of education: N/A   Occupational History  . managment- [employer]    Social History Main Topics  . Smoking status: Never Smoker  . Smokeless tobacco: Never Used  . Alcohol use Yes     Comment: rare  . Drug use: No  . Sexual activity: Not on file   Other Topics Concern  . Not on file   Social History Narrative   Lives w/ wife   Child born 2000, still at home    Lost father 01-2014                 Family History  Problem Relation Age of Onset  . Colon cancer Father 46       . Prostate cancer Father 33  . Diabetes Neg Hx   . Heart attack Neg Hx   . Stroke Neg Hx      Allergies as of 05/23/2016   No Known Allergies     Medication List       Accurate as of 05/23/16 11:59 PM. Always use your most recent med list.          esomeprazole 20 MG capsule Commonly known as:  NEXIUM Take 20 mg by mouth daily at 12 noon.   GOODY HEADACHE  PO Take 1 packet by mouth daily as needed (for pain).   metoprolol succinate 25 MG 24 hr tablet Commonly known as:  TOPROL-XL Take 1 tablet (25 mg total) by mouth daily.   MULTIVITAMIN PO Take 1 tablet by mouth daily.   NON FORMULARY Inject 1 Dose as directed once a week. Allergy Shot          Objective:   Physical Exam BP 122/68 (BP Location: Left Arm, Patient Position: Sitting, Cuff Size: Normal)   Pulse (!) 58   Temp 97.6 F (36.4 C) (Oral)   Resp 14   Ht  (1.905 m)   Wt 225 lb 4 oz (102.2 kg)   SpO2 97%   BMI 28.15 kg/m   General:   Well developed, well nourished . NAD.  Neck: No  thyromegaly  HEENT:  Normocephalic . Face symmetric, atraumatic Lungs:  CTA B Normal respiratory effort, no intercostal retractions, no accessory muscle use. Heart: RRR,  no murmur.  No pretibial edema  bilaterally  Abdomen:  Not distended, soft, non-tender. No rebound or rigidity.   Skin: Exposed areas without rash. Not pale. Not jaundice MSK: No TTP at the lower back. Elbows normal to inspection and palpation. Neurologic:  alert & oriented X3.  Speech normal, gait appropriate for age and unassisted Strength symmetric and appropriate for age.  Psych: Cognition and judgment appear intact.  Cooperative with normal attention span and concentration.  Behavior appropriate. No anxious or depressed appearing.    Assessment & Plan:   Assessment Anxiety  Palpitations on bb most day OSA, on CPAP GERD Allergic rhinitis, weekly shots, Dr [place] Callas DVT, RLE 2002 , (-)  coagulation panel  PLAN: Palpitations: Has not needed medication daily. OSA: On CPAP, reports he sees the specialist yearly GERD: currently on OTC Nexium daily, sxs controlled. Back pain: Recommend stretching and good posture. RTC one year

## 2016-05-23 NOTE — Patient Instructions (Signed)
GO TO THE LAB : Get the blood work     GO TO THE FRONT DESK Schedule your next appointment for a  physical exam in one year  You're due for a colonoscopy by October this year, if you don't hear from the GI doctor, please let us know.    Back Exercises The following exercises strengthen the muscles that help to support the back. They also help to keep the lower back flexible. Doing these exercises can help to prevent back pain or lessen existing pain. If you have back pain or discomfort, try doing these exercises 2-3 times each day or as told by your health care provider. When the pain goes away, do them once each day, but increase the number of times that you repeat the steps for each exercise (do more repetitions). If you do not have back pain or discomfort, do these exercises once each day or as told by your health care provider. Exercises Single Knee to Chest   Repeat these steps 3-5 times for each leg: 1. Lie on your back on a firm bed or the floor with your legs extended. 2. Bring one knee to your chest. Your other leg should stay extended and in contact with the floor. 3. Hold your knee in place by grabbing your knee or thigh. 4. Pull on your knee until you feel a gentle stretch in your lower back. 5. Hold the stretch for 10-30 seconds. 6. Slowly release and straighten your leg. Pelvic Tilt   Repeat these steps 5-10 times: 1. Lie on your back on a firm bed or the floor with your legs extended. 2. Bend your knees so they are pointing toward the ceiling and your feet are flat on the floor. 3. Tighten your lower abdominal muscles to press your lower back against the floor. This motion will tilt your pelvis so your tailbone points up toward the ceiling instead of pointing to your feet or the floor. 4. With gentle tension and even breathing, hold this position for 5-10 seconds. Cat-Cow   Repeat these steps until your lower back becomes more flexible: 1. Get into a hands-and-knees  position on a firm surface. Keep your hands under your shoulders, and keep your knees under your hips. You may place padding under your knees for comfort. 2. Let your head hang down, and point your tailbone toward the floor so your lower back becomes rounded like the back of a cat. 3. Hold this position for 5 seconds. 4. Slowly lift your head and point your tailbone up toward the ceiling so your back forms a sagging arch like the back of a cow. 5. Hold this position for 5 seconds. Press-Ups   Repeat these steps 5-10 times: 1. Lie on your abdomen (face-down) on the floor. 2. Place your palms near your head, about shoulder-width apart. 3. While you keep your back as relaxed as possible and keep your hips on the floor, slowly straighten your arms to raise the top half of your body and lift your shoulders. Do not use your back muscles to raise your upper torso. You may adjust the placement of your hands to make yourself more comfortable. 4. Hold this position for 5 seconds while you keep your back relaxed. 5. Slowly return to lying flat on the floor. Bridges   Repeat these steps 10 times: 1. Lie on your back on a firm surface. 2. Bend your knees so they are pointing toward the ceiling and your feet are flat on  the floor. 3. Tighten your buttocks muscles and lift your buttocks off of the floor until your waist is at almost the same height as your knees. You should feel the muscles working in your buttocks and the back of your thighs. If you do not feel these muscles, slide your feet 1-2 inches farther away from your buttocks. 4. Hold this position for 3-5 seconds. 5. Slowly lower your hips to the starting position, and allow your buttocks muscles to relax completely. If this exercise is too easy, try doing it with your arms crossed over your chest. Abdominal Crunches   Repeat these steps 5-10 times: 1. Lie on your back on a firm bed or the floor with your legs extended. 2. Bend your knees so  they are pointing toward the ceiling and your feet are flat on the floor. 3. Cross your arms over your chest. 4. Tip your chin slightly toward your chest without bending your neck. 5. Tighten your abdominal muscles and slowly raise your trunk (torso) high enough to lift your shoulder blades a tiny bit off of the floor. Avoid raising your torso higher than that, because it can put too much stress on your low back and it does not help to strengthen your abdominal muscles. 6. Slowly return to your starting position. Back Lifts  Repeat these steps 5-10 times: 1. Lie on your abdomen (face-down) with your arms at your sides, and rest your forehead on the floor. 2. Tighten the muscles in your legs and your buttocks. 3. Slowly lift your chest off of the floor while you keep your hips pressed to the floor. Keep the back of your head in line with the curve in your back. Your eyes should be looking at the floor. 4. Hold this position for 3-5 seconds. 5. Slowly return to your starting position. Contact a health care provider if:  Your back pain or discomfort gets much worse when you do an exercise.  Your back pain or discomfort does not lessen within 2 hours after you exercise. If you have any of these problems, stop doing these exercises right away. Do not do them again unless your health care provider says that you can. Get help right away if:  You develop sudden, severe back pain. If this happens, stop doing the exercises right away. Do not do them again unless your health care provider says that you can. This information is not intended to replace advice given to you by your health care provider. Make sure you discuss any questions you have with your health care provider. Document Released: 03/13/2004 Document Revised: 06/13/2015 Document Reviewed: 03/30/2014 Elsevier Interactive Patient Education  2017 [employer].

## 2016-05-24 NOTE — Assessment & Plan Note (Signed)
Palpitations: Has not needed medication daily. OSA: On CPAP, reports he sees the specialist yearly GERD: currently on OTC Nexium daily, sxs controlled. Back pain: Recommend stretching and good posture. RTC one year

## 2016-06-02 DIAGNOSIS — J301 Allergic rhinitis due to pollen: Secondary | ICD-10-CM | POA: Diagnosis not present

## 2016-06-02 DIAGNOSIS — J3089 Other allergic rhinitis: Secondary | ICD-10-CM | POA: Diagnosis not present

## 2016-06-03 DIAGNOSIS — G4733 Obstructive sleep apnea (adult) (pediatric): Secondary | ICD-10-CM | POA: Diagnosis not present

## 2016-06-09 DIAGNOSIS — J301 Allergic rhinitis due to pollen: Secondary | ICD-10-CM | POA: Diagnosis not present

## 2016-06-09 DIAGNOSIS — J3089 Other allergic rhinitis: Secondary | ICD-10-CM | POA: Diagnosis not present

## 2016-06-16 DIAGNOSIS — J301 Allergic rhinitis due to pollen: Secondary | ICD-10-CM | POA: Diagnosis not present

## 2016-06-16 DIAGNOSIS — J3089 Other allergic rhinitis: Secondary | ICD-10-CM | POA: Diagnosis not present

## 2016-06-27 DIAGNOSIS — J301 Allergic rhinitis due to pollen: Secondary | ICD-10-CM | POA: Diagnosis not present

## 2016-06-27 DIAGNOSIS — J3089 Other allergic rhinitis: Secondary | ICD-10-CM | POA: Diagnosis not present

## 2016-07-02 DIAGNOSIS — J3089 Other allergic rhinitis: Secondary | ICD-10-CM | POA: Diagnosis not present

## 2016-07-02 DIAGNOSIS — J301 Allergic rhinitis due to pollen: Secondary | ICD-10-CM | POA: Diagnosis not present

## 2016-07-23 DIAGNOSIS — J3089 Other allergic rhinitis: Secondary | ICD-10-CM | POA: Diagnosis not present

## 2016-07-23 DIAGNOSIS — J301 Allergic rhinitis due to pollen: Secondary | ICD-10-CM | POA: Diagnosis not present

## 2016-08-08 DIAGNOSIS — J301 Allergic rhinitis due to pollen: Secondary | ICD-10-CM | POA: Diagnosis not present

## 2016-08-08 DIAGNOSIS — J3089 Other allergic rhinitis: Secondary | ICD-10-CM | POA: Diagnosis not present

## 2016-08-22 DIAGNOSIS — H1045 Other chronic allergic conjunctivitis: Secondary | ICD-10-CM | POA: Diagnosis not present

## 2016-08-22 DIAGNOSIS — J3089 Other allergic rhinitis: Secondary | ICD-10-CM | POA: Diagnosis not present

## 2016-08-22 DIAGNOSIS — J301 Allergic rhinitis due to pollen: Secondary | ICD-10-CM | POA: Diagnosis not present

## 2016-09-01 DIAGNOSIS — J3089 Other allergic rhinitis: Secondary | ICD-10-CM | POA: Diagnosis not present

## 2016-09-01 DIAGNOSIS — J301 Allergic rhinitis due to pollen: Secondary | ICD-10-CM | POA: Diagnosis not present

## 2016-09-03 ENCOUNTER — Other Ambulatory Visit: Payer: Self-pay | Admitting: Internal Medicine

## 2016-09-03 DIAGNOSIS — J301 Allergic rhinitis due to pollen: Secondary | ICD-10-CM | POA: Diagnosis not present

## 2016-09-04 DIAGNOSIS — J3089 Other allergic rhinitis: Secondary | ICD-10-CM | POA: Diagnosis not present

## 2016-09-17 DIAGNOSIS — J301 Allergic rhinitis due to pollen: Secondary | ICD-10-CM | POA: Diagnosis not present

## 2016-09-17 DIAGNOSIS — J3089 Other allergic rhinitis: Secondary | ICD-10-CM | POA: Diagnosis not present

## 2016-09-25 DIAGNOSIS — J301 Allergic rhinitis due to pollen: Secondary | ICD-10-CM | POA: Diagnosis not present

## 2016-09-25 DIAGNOSIS — J3089 Other allergic rhinitis: Secondary | ICD-10-CM | POA: Diagnosis not present

## 2016-09-29 ENCOUNTER — Encounter: Payer: Self-pay | Admitting: Internal Medicine

## 2016-09-29 ENCOUNTER — Ambulatory Visit (INDEPENDENT_AMBULATORY_CARE_PROVIDER_SITE_OTHER): Payer: BLUE CROSS/BLUE SHIELD | Admitting: Internal Medicine

## 2016-09-29 VITALS — BP 124/68 | HR 58 | Temp 97.8°F | Resp 14 | Ht 75.0 in | Wt 219.4 lb

## 2016-09-29 DIAGNOSIS — M545 Low back pain, unspecified: Secondary | ICD-10-CM

## 2016-09-29 DIAGNOSIS — J301 Allergic rhinitis due to pollen: Secondary | ICD-10-CM | POA: Diagnosis not present

## 2016-09-29 DIAGNOSIS — J3089 Other allergic rhinitis: Secondary | ICD-10-CM | POA: Diagnosis not present

## 2016-09-29 MED ORDER — PREDNISONE 10 MG PO TABS: ORAL_TABLET | ORAL | 0 refills | Status: DC

## 2016-09-29 MED ORDER — CYCLOBENZAPRINE HCL 10 MG PO TABS: 10.0000 mg | ORAL_TABLET | Freq: Every evening | ORAL | 0 refills | Status: DC | PRN

## 2016-09-29 NOTE — Progress Notes (Signed)
Pre visit review using our clinic review tool, if applicable. No additional management support is needed unless otherwise documented below in the visit note. 

## 2016-09-29 NOTE — Progress Notes (Signed)
Subjective:    Patient ID: Ronnie [name], male    DOB: November 15, 1960, 56 y.o.   MRN: [id number]  DOS:  09/29/2016 Type of visit - description : acute Interval history: Symptoms started 5 days ago with pain at the lower and right side of the lower back, persistent worse with certain movements, at times intense. Today for the first time pain has decreased in intensity but this is still there  ("nagging"). 3 days ago the pain went down to the buttock and at the same time had some pain at the right heel, but that seems to be resolved  Review of Systems Denies any injury but the he is very active at work with occational lifting. No nausea, vomiting. No actual lower extremity paresthesias.  Past Medical History:  Diagnosis Date  . Allergic rhinitis   . Anxiety   . DVT (deep venous thrombosis) (HCC)    RLE 2002 aprox, (-) anticoag panel  . GERD (gastroesophageal reflux disease)   . OSA on CPAP   . Palpitations    on betablokers    Past Surgical History:  Procedure Laterality Date  . NASAL SEPTOPLASTY W/ TURBINOPLASTY    . TONSILLECTOMY      Social History   Social History  . Marital status: Married    Spouse name: N/A  . Number of children: 1  . Years of education: N/A   Occupational History  . managment- [employer]    Social History Main Topics  . Smoking status: Never Smoker  . Smokeless tobacco: Never Used  . Alcohol use Yes     Comment: rare  . Drug use: No  . Sexual activity: Not on file   Other Topics Concern  . Not on file   Social History Narrative   Lives w/ wife   Child born 2000, still at home    Lost father 01-2014                  Allergies as of 09/29/2016   No Known Allergies     Medication List       Accurate as of 09/29/16 11:59 PM. Always use your most recent med list.          cyclobenzaprine 10 MG tablet Commonly known as:  FLEXERIL Take 1 tablet (10 mg total) by mouth at bedtime as needed for muscle spasms.     esomeprazole 20 MG capsule Commonly known as:  NEXIUM Take 20 mg by mouth daily at 12 noon.   GOODY HEADACHE PO Take 1 packet by mouth daily as needed (for pain).   metoprolol succinate 25 MG 24 hr tablet Commonly known as:  TOPROL-XL Take 1 tablet (25 mg total) by mouth daily.   MULTIVITAMIN PO Take 1 tablet by mouth daily.   NON FORMULARY Inject 1 Dose as directed once a week. Allergy Shot   predniSONE 10 MG tablet Commonly known as:  DELTASONE 4 tablets x 2 days, 3 tabs x 2 days, 2 tabs x 2 days, 1 tab x 2 days          Objective:   Physical Exam BP 124/68 (BP Location: Left Arm, Patient Position: Sitting, Cuff Size: Normal)   Pulse (!) 58   Temp 97.8 F (36.6 C) (Oral)   Resp 14   Ht 6\' 3"  (1.905 m)   Wt 219 lb 6 oz (99.5 kg)   SpO2 97%   BMI 27.42 kg/m  General:   Well developed, well nourished .  NAD.  HEENT:  Normocephalic . Face symmetric, atraumatic MSK: Slightly TTP at the distal lumbar spine. Not tender and the sacroiliac joints.  Skin: Not pale. Not jaundice Neurologic:  alert & oriented X3.  Speech normal, gait appropriate , not antalgic. DTRs symmetric Straight leg test negative. Psych--  Cognition and judgment appear intact.  Cooperative with normal attention span and concentration.  Behavior appropriate. No anxious or depressed appearing.      Assessment & Plan:   Assessment Anxiety  Palpitations on bb most day OSA, on CPAP GERD Allergic rhinitis, weekly shots, Dr [place] CallasSharma DVT, RLE 2002 , (-)  coagulation panel  PLAN: Lumbalgia: Acute, today for the first time is better than previous days, although has some radicular features (pain at the buttock, heel) , the neurological exam is normal. Will treat as a MSK issue with prednisone, Flexeril, redness, heat, cold. See instructions. Call if not better.

## 2016-09-29 NOTE — Patient Instructions (Signed)
  Prednisone as prescribed for few days  Tylenol  500 mg OTC 2 tabs a day every 8 hours as needed for pain  You also can continue taking ibuprofen as before  Flexeril, a muscle relaxant, at bedtime if you feel "tight" in the back  Once the pain is better, continue stretching daily  Heat or cold twice a day  Call if not gradually better over the next 10 days

## 2016-09-30 NOTE — Assessment & Plan Note (Signed)
Lumbalgia: Acute, today for the first time is better than previous days, although has some radicular features (pain at the buttock, heel) , the neurological exam is normal. Will treat as a MSK issue with prednisone, Flexeril, redness, heat, cold. See instructions. Call if not better.

## 2016-10-06 DIAGNOSIS — J3089 Other allergic rhinitis: Secondary | ICD-10-CM | POA: Diagnosis not present

## 2016-10-06 DIAGNOSIS — J301 Allergic rhinitis due to pollen: Secondary | ICD-10-CM | POA: Diagnosis not present

## 2016-10-21 DIAGNOSIS — N400 Enlarged prostate without lower urinary tract symptoms: Secondary | ICD-10-CM | POA: Diagnosis not present

## 2016-10-27 DIAGNOSIS — J301 Allergic rhinitis due to pollen: Secondary | ICD-10-CM | POA: Diagnosis not present

## 2016-10-27 DIAGNOSIS — R350 Frequency of micturition: Secondary | ICD-10-CM | POA: Diagnosis not present

## 2016-10-27 DIAGNOSIS — N400 Enlarged prostate without lower urinary tract symptoms: Secondary | ICD-10-CM | POA: Diagnosis not present

## 2016-10-27 DIAGNOSIS — J3089 Other allergic rhinitis: Secondary | ICD-10-CM | POA: Diagnosis not present

## 2016-11-03 ENCOUNTER — Encounter: Payer: Self-pay | Admitting: Podiatry

## 2016-11-03 ENCOUNTER — Ambulatory Visit (INDEPENDENT_AMBULATORY_CARE_PROVIDER_SITE_OTHER): Payer: BLUE CROSS/BLUE SHIELD

## 2016-11-03 ENCOUNTER — Other Ambulatory Visit: Payer: Self-pay | Admitting: Podiatry

## 2016-11-03 ENCOUNTER — Ambulatory Visit (INDEPENDENT_AMBULATORY_CARE_PROVIDER_SITE_OTHER): Payer: BLUE CROSS/BLUE SHIELD | Admitting: Podiatry

## 2016-11-03 VITALS — BP 122/74 | HR 63

## 2016-11-03 DIAGNOSIS — M79671 Pain in right foot: Secondary | ICD-10-CM

## 2016-11-03 DIAGNOSIS — M722 Plantar fascial fibromatosis: Secondary | ICD-10-CM | POA: Diagnosis not present

## 2016-11-03 DIAGNOSIS — M21619 Bunion of unspecified foot: Secondary | ICD-10-CM | POA: Diagnosis not present

## 2016-11-03 DIAGNOSIS — J301 Allergic rhinitis due to pollen: Secondary | ICD-10-CM | POA: Diagnosis not present

## 2016-11-03 DIAGNOSIS — J3089 Other allergic rhinitis: Secondary | ICD-10-CM | POA: Diagnosis not present

## 2016-11-03 DIAGNOSIS — M79672 Pain in left foot: Secondary | ICD-10-CM | POA: Diagnosis not present

## 2016-11-03 MED ORDER — DICLOFENAC SODIUM 75 MG PO TBEC: 75.0000 mg | DELAYED_RELEASE_TABLET | Freq: Two times a day (BID) | ORAL | 2 refills | Status: DC

## 2016-11-03 MED ORDER — TRIAMCINOLONE ACETONIDE 10 MG/ML IJ SUSP
10.0000 mg | Freq: Once | INTRAMUSCULAR | Status: AC
  Administered 2016-11-03: 10 mg

## 2016-11-03 NOTE — Patient Instructions (Signed)

## 2016-11-03 NOTE — Progress Notes (Signed)
Subjective:    Patient ID: Ronnie [name], male   DOB: 56 y.o.   MRN: [id number]   HPI patient presents stating both his feet are bothering him some and the right one has been very sore in the heel and he's had a history of bunion surgery right years ago and does have structural bunion deformity left    Review of Systems  All other systems reviewed and are negative.       Objective:  Physical Exam  Constitutional: He appears well-developed and well-nourished.  Cardiovascular: Intact distal pulses.   Pulmonary/Chest: Effort normal.  Musculoskeletal: Normal range of motion.  Neurological: He is alert.  Skin: Skin is warm.  Nursing note and vitals reviewed.  neurovascular status intact muscle strength adequate range of motion within normal limits with patient noted to have exquisite discomfort plantar aspect right heel at the insertional point tendon into the calcaneus with inflammation fluid around the medial band. Patient has structural bunion deformity left with redness around the joint surface and has mild dorsal edema left that's minimally tender when pressed     Assessment:  Acute plantar fasciitis right with patient noted to have structural bunion deformity left moderate depression of the arch bilateral and mild dorsal edema     Plan:    H&P conditions reviewed and today injected the right plantar fascia 3 mg Kenalog 5 mg Xylocaine and I applied fascial brace with instructions on physical therapy and placed on diclofenac 75 mg twice a day. Reappoint 2 weeks and may be good orthotic candidate  X-rays indicate that there is moderate depression of the arch with structural deformity and history of bunion correction right with structural bunion deformity left and minimal plantar spur noted

## 2016-11-03 NOTE — Progress Notes (Signed)
   Subjective:    Patient ID: Ronnie [name], male    DOB: April 18, 1960, 56 y.o.   MRN: [id number]  HPI  Chief Complaint  Patient presents with  . Foot Pain    Rt foot       Review of Systems  Musculoskeletal: Positive for back pain.  All other systems reviewed and are negative.      Objective:   Physical Exam        Assessment & Plan:

## 2016-11-10 ENCOUNTER — Encounter: Payer: Self-pay | Admitting: Pulmonary Disease

## 2016-11-10 ENCOUNTER — Ambulatory Visit (INDEPENDENT_AMBULATORY_CARE_PROVIDER_SITE_OTHER): Payer: BLUE CROSS/BLUE SHIELD | Admitting: Pulmonary Disease

## 2016-11-10 VITALS — BP 108/70 | HR 59 | Ht 75.0 in | Wt 214.4 lb

## 2016-11-10 DIAGNOSIS — G4733 Obstructive sleep apnea (adult) (pediatric): Secondary | ICD-10-CM | POA: Diagnosis not present

## 2016-11-10 DIAGNOSIS — Z9989 Dependence on other enabling machines and devices: Secondary | ICD-10-CM | POA: Diagnosis not present

## 2016-11-10 NOTE — Patient Instructions (Signed)
Will arrange for new CPAP supplies  Follow up in 1 year 

## 2016-11-10 NOTE — Progress Notes (Signed)
Current Outpatient Prescriptions on File Prior to Visit  Medication Sig  . Aspirin-Acetaminophen-Caffeine (GOODY HEADACHE PO) Take 1 packet by mouth daily as needed (for pain).  Marland Kitchen esomeprazole (NEXIUM) 20 MG capsule Take 20 mg by mouth daily at 12 noon.  . metoprolol succinate (TOPROL-XL) 25 MG 24 hr tablet Take 1 tablet (25 mg total) by mouth daily.  . Multiple Vitamins-Minerals (MULTIVITAMIN PO) Take 1 tablet by mouth daily.  . NON FORMULARY Inject 1 Dose as directed once a week. Allergy Shot   No current facility-administered medications on file prior to visit.      Chief Complaint  Patient presents with  . Follow-up    Pt doing well overall. Pt using CPAP each night when pt can. [facility] pt in need of new CPAP mask and supplies today    Sleep tests PSG 04/10/03 >> AHI 43 Auto CPAP 10/08/16 to 11/06/16 >> used on 30 of 30 nights with average 5 hrs 5 min.  Average AHI 3 with median CPAP 7 and 95 th percentile CPAP 10 cm H2O  Past medical history GERD, DVT 2002, Anxiety, Palpitations  Past surgical history, Family history, Social history, Allergies reviewed  Vital Signs BP 108/70 (BP Location: Left Arm, Cuff Size: Normal)   Pulse (!) 59   Ht  (1.905 m)   Wt 214 lb 6.4 oz (97.3 kg)   SpO2 97%   BMI 26.80 kg/m   History of Present Illness Ronnie [name] is a 56 y.o. male with obstructive sleep apnea.  He uses CPAP nightly.  No issues with mask fit.  Sleeping well and feels rested.  Needs new supplies.  Physical Exam  General - pleasant Eyes - pupils reactive ENT - no sinus tenderness, no oral exudate, no LAN, MP 3 Cardiac - regular, no murmur Chest - no wheeze, rales Abd - soft, non tender Ext - no edema Skin - no rashes Neuro - normal strength Psych - normal mood   Assessment/Plan  Obstructive sleep apnea. - he is compliant with CPAP and reports benefit - continue auto CPAP range 5 to 12 cm H2O - will arrange for new CPAP supplies   Patient Instructions   Will arrange for new CPAP supplies  Follow up in 1 year   Coralyn Helling, MD [facility] Pager:  [phone] 11/10/2016, 12:13 PM

## 2016-11-17 ENCOUNTER — Ambulatory Visit (INDEPENDENT_AMBULATORY_CARE_PROVIDER_SITE_OTHER): Payer: BLUE CROSS/BLUE SHIELD | Admitting: Podiatry

## 2016-11-17 DIAGNOSIS — J301 Allergic rhinitis due to pollen: Secondary | ICD-10-CM | POA: Diagnosis not present

## 2016-11-17 DIAGNOSIS — J3089 Other allergic rhinitis: Secondary | ICD-10-CM | POA: Diagnosis not present

## 2016-11-17 DIAGNOSIS — M722 Plantar fascial fibromatosis: Secondary | ICD-10-CM

## 2016-11-17 DIAGNOSIS — M21619 Bunion of unspecified foot: Secondary | ICD-10-CM

## 2016-11-17 NOTE — Progress Notes (Signed)
Subjective:    Patient ID: Ronnie [name], male   DOB: 56 y.o.   MRN: [id number]   HPI patient states my foot is much improved but I know I need some form of support for the long-term    ROS      Objective:  Physical Exam neurovascular status intact with patient's right heel significantly improved with history of structural bunion with moderate flatfoot deformity noted bilateral     Assessment:   Plantar fasciitis of the right heel improving with tendinitis condition and bunion      Plan:    H&P conditions reviewed and recommended long-term orthotics with scanning done today. I then went ahead and discussed continued physical therapy supportive shoes and possible further treatments for the chronic fasciitis

## 2016-11-21 DIAGNOSIS — G4733 Obstructive sleep apnea (adult) (pediatric): Secondary | ICD-10-CM | POA: Diagnosis not present

## 2016-12-08 ENCOUNTER — Ambulatory Visit: Payer: BLUE CROSS/BLUE SHIELD | Admitting: Orthotics

## 2016-12-08 DIAGNOSIS — J301 Allergic rhinitis due to pollen: Secondary | ICD-10-CM | POA: Diagnosis not present

## 2016-12-08 DIAGNOSIS — M21619 Bunion of unspecified foot: Secondary | ICD-10-CM

## 2016-12-08 DIAGNOSIS — J3089 Other allergic rhinitis: Secondary | ICD-10-CM | POA: Diagnosis not present

## 2016-12-08 DIAGNOSIS — M722 Plantar fascial fibromatosis: Secondary | ICD-10-CM

## 2016-12-08 NOTE — Progress Notes (Signed)
Patient came in today to pick up custom made foot orthotics.  The goals were accomplished and the patient reported no dissatisfaction with said orthotics.  Patient was advised of breakin period and how to report any issues. 

## 2016-12-19 DIAGNOSIS — J301 Allergic rhinitis due to pollen: Secondary | ICD-10-CM | POA: Diagnosis not present

## 2016-12-19 DIAGNOSIS — J3089 Other allergic rhinitis: Secondary | ICD-10-CM | POA: Diagnosis not present

## 2016-12-22 DIAGNOSIS — G4733 Obstructive sleep apnea (adult) (pediatric): Secondary | ICD-10-CM | POA: Diagnosis not present

## 2016-12-24 DIAGNOSIS — J3089 Other allergic rhinitis: Secondary | ICD-10-CM | POA: Diagnosis not present

## 2016-12-24 DIAGNOSIS — J301 Allergic rhinitis due to pollen: Secondary | ICD-10-CM | POA: Diagnosis not present

## 2017-02-13 DIAGNOSIS — J3089 Other allergic rhinitis: Secondary | ICD-10-CM | POA: Diagnosis not present

## 2017-02-13 DIAGNOSIS — J301 Allergic rhinitis due to pollen: Secondary | ICD-10-CM | POA: Diagnosis not present

## 2017-03-27 DIAGNOSIS — J3089 Other allergic rhinitis: Secondary | ICD-10-CM | POA: Diagnosis not present

## 2017-03-27 DIAGNOSIS — J301 Allergic rhinitis due to pollen: Secondary | ICD-10-CM | POA: Diagnosis not present

## 2017-04-09 DIAGNOSIS — Z1211 Encounter for screening for malignant neoplasm of colon: Secondary | ICD-10-CM | POA: Diagnosis not present

## 2017-04-09 DIAGNOSIS — Z86010 Personal history of colonic polyps: Secondary | ICD-10-CM | POA: Diagnosis not present

## 2017-04-09 DIAGNOSIS — Z800 Family history of malignant neoplasm of digestive organs: Secondary | ICD-10-CM | POA: Diagnosis not present

## 2017-04-09 LAB — HM COLONOSCOPY

## 2017-04-13 ENCOUNTER — Encounter: Payer: Self-pay | Admitting: Internal Medicine

## 2017-04-21 DIAGNOSIS — J301 Allergic rhinitis due to pollen: Secondary | ICD-10-CM | POA: Diagnosis not present

## 2017-04-21 DIAGNOSIS — J3089 Other allergic rhinitis: Secondary | ICD-10-CM | POA: Diagnosis not present

## 2017-04-24 DIAGNOSIS — J301 Allergic rhinitis due to pollen: Secondary | ICD-10-CM | POA: Diagnosis not present

## 2017-04-24 DIAGNOSIS — J3089 Other allergic rhinitis: Secondary | ICD-10-CM | POA: Diagnosis not present

## 2017-04-29 DIAGNOSIS — J301 Allergic rhinitis due to pollen: Secondary | ICD-10-CM | POA: Diagnosis not present

## 2017-04-29 DIAGNOSIS — J3089 Other allergic rhinitis: Secondary | ICD-10-CM | POA: Diagnosis not present

## 2017-04-29 DIAGNOSIS — J3081 Allergic rhinitis due to animal (cat) (dog) hair and dander: Secondary | ICD-10-CM | POA: Diagnosis not present

## 2017-05-11 DIAGNOSIS — J101 Influenza due to other identified influenza virus with other respiratory manifestations: Secondary | ICD-10-CM | POA: Diagnosis not present

## 2017-05-14 DIAGNOSIS — G4733 Obstructive sleep apnea (adult) (pediatric): Secondary | ICD-10-CM | POA: Diagnosis not present

## 2017-05-20 DIAGNOSIS — J301 Allergic rhinitis due to pollen: Secondary | ICD-10-CM | POA: Diagnosis not present

## 2017-05-20 DIAGNOSIS — J3089 Other allergic rhinitis: Secondary | ICD-10-CM | POA: Diagnosis not present

## 2017-05-29 ENCOUNTER — Encounter: Payer: BLUE CROSS/BLUE SHIELD | Admitting: Internal Medicine

## 2017-06-08 ENCOUNTER — Ambulatory Visit (INDEPENDENT_AMBULATORY_CARE_PROVIDER_SITE_OTHER): Payer: BLUE CROSS/BLUE SHIELD | Admitting: Internal Medicine

## 2017-06-08 ENCOUNTER — Encounter: Payer: Self-pay | Admitting: Internal Medicine

## 2017-06-08 VITALS — BP 126/74 | HR 57 | Temp 98.1°F | Resp 14 | Ht 75.0 in | Wt 226.0 lb

## 2017-06-08 DIAGNOSIS — Z1159 Encounter for screening for other viral diseases: Secondary | ICD-10-CM

## 2017-06-08 DIAGNOSIS — Z0000 Encounter for general adult medical examination without abnormal findings: Secondary | ICD-10-CM

## 2017-06-08 LAB — CBC WITH DIFFERENTIAL/PLATELET
Basophils Absolute: 0 10*3/uL (ref 0.0–0.1)
Basophils Relative: 0.3 % (ref 0.0–3.0)
Eosinophils Absolute: 0 10*3/uL (ref 0.0–0.7)
Eosinophils Relative: 1 % (ref 0.0–5.0)
HCT: 40.7 % (ref 39.0–52.0)
Hemoglobin: 13.4 g/dL (ref 13.0–17.0)
Lymphocytes Relative: 29.4 % (ref 12.0–46.0)
Lymphs Abs: 1.1 10*3/uL (ref 0.7–4.0)
MCHC: 32.9 g/dL (ref 30.0–36.0)
MCV: 93.5 fl (ref 78.0–100.0)
Monocytes Absolute: 0.5 10*3/uL (ref 0.1–1.0)
Monocytes Relative: 13.2 % — ABNORMAL HIGH (ref 3.0–12.0)
Neutro Abs: 2 10*3/uL (ref 1.4–7.7)
Neutrophils Relative %: 56.1 % (ref 43.0–77.0)
Platelets: 153 10*3/uL (ref 150.0–400.0)
RBC: 4.36 Mil/uL (ref 4.22–5.81)
RDW: 12.8 % (ref 11.5–15.5)
WBC: 3.6 10*3/uL — ABNORMAL LOW (ref 4.0–10.5)

## 2017-06-08 LAB — COMPREHENSIVE METABOLIC PANEL
ALT: 25 U/L (ref 0–53)
AST: 36 U/L (ref 0–37)
Albumin: 4.1 g/dL (ref 3.5–5.2)
Alkaline Phosphatase: 53 U/L (ref 39–117)
BUN: 11 mg/dL (ref 6–23)
CO2: 25 mEq/L (ref 19–32)
Calcium: 9.3 mg/dL (ref 8.4–10.5)
Chloride: 107 mEq/L (ref 96–112)
Creatinine, Ser: 0.95 mg/dL (ref 0.40–1.50)
GFR: 106.08 mL/min (ref >60.00)
Glucose, Bld: 92 mg/dL (ref 70–99)
Potassium: 3.8 mEq/L (ref 3.5–5.1)
Sodium: 140 mEq/L (ref 135–145)
Total Bilirubin: 0.9 mg/dL (ref 0.2–1.2)
Total Protein: 7 g/dL (ref 6.0–8.3)

## 2017-06-08 LAB — LIPID PANEL
Cholesterol: 111 mg/dL (ref 0–200)
HDL: 40.5 mg/dL (ref >39.00)
LDL Cholesterol: 57 mg/dL (ref 0–99)
NonHDL: 70.66
Total CHOL/HDL Ratio: 3
Triglycerides: 67 mg/dL (ref 0.0–149.0)
VLDL: 13.4 mg/dL (ref 0.0–40.0)

## 2017-06-08 NOTE — Assessment & Plan Note (Addendum)
--  Td 2015 --CCS: +FH Cscope 10-08 per pt (2 polyps) cscope 11-2011 cscope 03/2017 , neg, next 5 years (Dr Candelaria StagersMeadoff) --[facility]  Urology per pt,   no documentation , next f/u 09-2017 --Discussed diet-exercise: Doing well.  Praised. --labs: Hep C, CMP, FLP, CBC RTC 1 year

## 2017-06-08 NOTE — Patient Instructions (Signed)
GO TO THE LAB : Get the blood work     GO TO THE FRONT DESK Schedule your next appointment for a   physical exam in 1 year, fasting 

## 2017-06-08 NOTE — Progress Notes (Signed)
Subjective:    Patient ID: Ronnie [name], male    DOB: [birth year], 57 y.o.   MRN: [id number]  DOS:  06/08/2017 Type of visit - description : cpx Interval history: In general feeling well, he is active, trying to exercise almost daily.   Review of Systems Had a colonoscopy, occasionally stools are hard since he had a colonoscopy but no other symptoms such as a stomach pain or blood in the stools GERD: Controlled as long as he takes PPI.  Other than above, a 14 point review of systems is negative     Past Medical History:  Diagnosis Date  . Allergic rhinitis   . Anxiety   . DVT (deep venous thrombosis) (HCC)    RLE 2002 aprox, (-) anticoag panel  . GERD (gastroesophageal reflux disease)   . OSA on CPAP   . Palpitations    on betablokers    Past Surgical History:  Procedure Laterality Date  . NASAL SEPTOPLASTY W/ TURBINOPLASTY    . TONSILLECTOMY      Social History   Socioeconomic History  . Marital status: Married    Spouse name: Not on file  . Number of children: 1  . Years of education: Not on file  . Highest education level: Not on file  Occupational History  . Occupation: Catering managerconsulting work  Engineer, productionocial Needs  . Financial resource strain: Not on file  . Food insecurity:    Worry: Not on file    Inability: Not on file  . Transportation needs:    Medical: Not on file    Non-medical: Not on file  Tobacco Use  . Smoking status: Never Smoker  . Smokeless tobacco: Never Used  Substance and Sexual Activity  . Alcohol use: Yes    Comment: rare  . Drug use: No  . Sexual activity: Not on file  Lifestyle  . Physical activity:    Days per week: Not on file    Minutes per session: Not on file  . Stress: Not on file  Relationships  . Social connections:    Talks on phone: Not on file    Gets together: Not on file    Attends religious service: Not on file    Active member of club or organization: Not on file    Attends meetings of clubs or organizations: Not on  file    Relationship status: Not on file  . Intimate partner violence:    Fear of current or ex partner: Not on file    Emotionally abused: Not on file    Physically abused: Not on file    Forced sexual activity: Not on file  Other Topics Concern  . Not on file  Social History Narrative   Lives w/ wife   Child born 2000, still at home    Father in law live w/ them   Lost father 01-2014              Family History  Problem Relation Age of Onset  . Colon cancer Father [id number]          . Prostate cancer Father 1965  . Diabetes Neg Hx   . Heart attack Neg Hx   . Stroke Neg Hx      Allergies as of 06/08/2017   No Known Allergies     Medication List        Accurate as of 06/08/17  5:05 PM. Always use your most recent med list.  esomeprazole 20 MG capsule Commonly known as:  NEXIUM Take 20 mg by mouth daily at 12 noon.   GOODY HEADACHE PO Take 1 packet by mouth daily as needed (for pain).   metoprolol succinate 25 MG 24 hr tablet Commonly known as:  TOPROL-XL Take 1 tablet (25 mg total) by mouth daily.   MULTIVITAMIN PO Take 1 tablet by mouth daily.   NON FORMULARY Inject 1 Dose as directed once a week. Allergy Shot          Objective:   Physical Exam BP 126/74 (BP Location: Left Arm, Patient Position: Sitting, Cuff Size: Normal)   Pulse (!) 57   Temp 98.1 F (36.7 C) (Oral)   Resp 14   Ht 6\' 3"  (1.905 m)   Wt 226 lb (102.5 kg)   SpO2 98%   BMI 28.25 kg/m  General:   Well developed, well nourished . NAD.  Neck: No  thyromegaly  HEENT:  Normocephalic . Face symmetric, atraumatic Lungs:  CTA B Normal respiratory effort, no intercostal retractions, no accessory muscle use. Heart: RRR,  no murmur.  No pretibial edema bilaterally  Abdomen:  Not distended, soft, non-tender. No rebound or rigidity.   Skin: Exposed areas without rash. Not pale. Not jaundice Neurologic:  alert & oriented X3.  Speech normal, gait appropriate for age and  unassisted Strength symmetric and appropriate for age.  Psych: Cognition and judgment appear intact.  Cooperative with normal attention span and concentration.  Behavior appropriate. No anxious or depressed appearing.     Assessment & Plan:   Assessment Anxiety  Palpitations on bb qd OSA, on CPAP GERD Allergic rhinitis, weekly shots, Dr [place] Callas DVT, RLE 2002 , (-)  coagulation panel  PLAN: Palpitations: On daily beta-blockers, controlled, RF as needed OSA: Reports good compliance with CPAP GERD: Controlled as long as he takes PPI RTC 1 year

## 2017-06-08 NOTE — Progress Notes (Signed)
Pre visit review using our clinic review tool, if applicable. No additional management support is needed unless otherwise documented below in the visit note. 

## 2017-06-08 NOTE — Assessment & Plan Note (Signed)
Palpitations: On daily beta-blockers, controlled, RF as needed OSA: Reports good compliance with CPAP GERD: Controlled as long as he takes PPI RTC 1 year

## 2017-06-09 LAB — HEPATITIS C ANTIBODY
Hepatitis C Ab: NONREACTIVE
SIGNAL TO CUT-OFF: 0.11 (ref <1.00)

## 2017-06-17 DIAGNOSIS — J301 Allergic rhinitis due to pollen: Secondary | ICD-10-CM | POA: Diagnosis not present

## 2017-06-17 DIAGNOSIS — J3089 Other allergic rhinitis: Secondary | ICD-10-CM | POA: Diagnosis not present

## 2017-06-24 DIAGNOSIS — J301 Allergic rhinitis due to pollen: Secondary | ICD-10-CM | POA: Diagnosis not present

## 2017-06-24 DIAGNOSIS — J3089 Other allergic rhinitis: Secondary | ICD-10-CM | POA: Diagnosis not present

## 2017-07-07 DIAGNOSIS — J3089 Other allergic rhinitis: Secondary | ICD-10-CM | POA: Diagnosis not present

## 2017-07-07 DIAGNOSIS — J301 Allergic rhinitis due to pollen: Secondary | ICD-10-CM | POA: Diagnosis not present

## 2017-07-27 ENCOUNTER — Encounter: Payer: Self-pay | Admitting: Internal Medicine

## 2017-07-27 ENCOUNTER — Ambulatory Visit: Payer: BLUE CROSS/BLUE SHIELD | Admitting: Internal Medicine

## 2017-07-27 VITALS — BP 104/70 | HR 58 | Temp 97.9°F | Resp 16 | Ht 75.0 in | Wt 225.2 lb

## 2017-07-27 DIAGNOSIS — K219 Gastro-esophageal reflux disease without esophagitis: Secondary | ICD-10-CM

## 2017-07-27 DIAGNOSIS — R198 Other specified symptoms and signs involving the digestive system and abdomen: Secondary | ICD-10-CM | POA: Diagnosis not present

## 2017-07-27 MED ORDER — DICYCLOMINE HCL 20 MG PO TABS: 20.0000 mg | ORAL_TABLET | Freq: Three times a day (TID) | ORAL | 0 refills | Status: DC

## 2017-07-27 MED ORDER — PANTOPRAZOLE SODIUM 40 MG PO TBEC: 40.0000 mg | DELAYED_RELEASE_TABLET | Freq: Every day | ORAL | 2 refills | Status: DC

## 2017-07-27 NOTE — Patient Instructions (Signed)
Change over-the-counter Nexium to Protonix: Take 1 tablet before breakfast every morning  For the next 4 to 5 weeks, take a probiotic over-the-counter such as ALIGN  If you have a lot of stomach discomfort, take BENTYL as needed.  If you are not gradually back to normal let me know   Food Choices for Gastroesophageal Reflux Disease, Adult When you have gastroesophageal reflux disease (GERD), the foods you eat and your eating habits are very important. Choosing the right foods can help ease the discomfort of GERD. Consider working with a diet and nutrition specialist (dietitian) to help you make healthy food choices. What general guidelines should I follow? Eating plan  Choose healthy foods low in fat, such as fruits, vegetables, whole grains, low-fat dairy products, and lean meat, fish, and poultry.  Eat frequent, small meals instead of three large meals each day. Eat your meals slowly, in a relaxed setting. Avoid bending over or lying down until 2-3 hours after eating.  Limit high-fat foods such as fatty meats or fried foods.  Limit your intake of oils, butter, and shortening to less than 8 teaspoons each day.  Avoid the following: ? Foods that cause symptoms. These may be different for different people. Keep a food diary to keep track of foods that cause symptoms. ? Alcohol. ? Drinking large amounts of liquid with meals. ? Eating meals during the 2-3 hours before bed.  Cook foods using methods other than frying. This may include baking, grilling, or broiling. Lifestyle   Maintain a healthy weight. Ask your health care provider what weight is healthy for you. If you need to lose weight, work with your health care provider to do so safely.  Exercise for at least 30 minutes on 5 or more days each week, or as told by your health care provider.  Avoid wearing clothes that fit tightly around your waist and chest.  Do not use any products that contain nicotine or tobacco, such as  cigarettes and e-cigarettes. If you need help quitting, ask your health care provider.  Sleep with the head of your bed raised. Use a wedge under the mattress or blocks under the bed frame to raise the head of the bed. What foods are not recommended? The items listed may not be a complete list. Talk with your dietitian about what dietary choices are best for you. Grains Pastries or quick breads with added fat. [place] toast. Vegetables Deep fried vegetables. [place] fries. Any vegetables prepared with added fat. Any vegetables that cause symptoms. For some people this may include tomatoes and tomato products, chili peppers, onions and garlic, and horseradish. Fruits Any fruits prepared with added fat. Any fruits that cause symptoms. For some people this may include citrus fruits, such as oranges, grapefruit, pineapple, and lemons. Meats and other protein foods High-fat meats, such as fatty beef or pork, hot dogs, ribs, ham, sausage, salami and bacon. Fried meat or protein, including fried fish and fried chicken. Nuts and nut butters. Dairy Whole milk and chocolate milk. Sour cream. Cream. Ice cream. Cream cheese. Milk shakes. Beverages Coffee and tea, with or without caffeine. Carbonated beverages. Sodas. Energy drinks. Fruit juice made with acidic fruits (such as orange or grapefruit). Tomato juice. Alcoholic drinks. Fats and oils Butter. Margarine. Shortening. Ghee. Sweets and desserts Chocolate and cocoa. Donuts. Seasoning and other foods Pepper. Peppermint and spearmint. Any condiments, herbs, or seasonings that cause symptoms. For some people, this may include curry, hot sauce, or vinegar-based salad dressings. Summary  When  you have gastroesophageal reflux disease (GERD), food and lifestyle choices are very important to help ease the discomfort of GERD.  Eat frequent, small meals instead of three large meals each day. Eat your meals slowly, in a relaxed setting. Avoid bending over or  lying down until 2-3 hours after eating.  Limit high-fat foods such as fatty meat or fried foods. This information is not intended to replace advice given to you by your health care provider. Make sure you discuss any questions you have with your health care provider. Document Released: 02/03/2005 Document Revised: 02/05/2016 Document Reviewed: 02/05/2016 Elsevier Interactive Patient Education  [employer]2018 Elsevier Inc.

## 2017-07-27 NOTE — Assessment & Plan Note (Signed)
GERD: Well-controlled until 2 weeks ago, currently on Nexium 20 mg daily.  Take this in the morning.  Plan: Change to pantoprazole 40, call if not improving.diet discussed  Irregular bowel movements: Symptoms started after normal colonoscopy 03-2017.  Unclear etiology, he does see traces of red blood with hard BMs but otherwise no fever, chills or weight loss.  Recommend probiotics, trial with Bentyl, call if not gradually improving.

## 2017-07-27 NOTE — Progress Notes (Signed)
Subjective:    Patient ID: Ronnie [name], male    DOB: Jan 17, 1961, 57 y.o.   MRN: [id number]  DOS:  07/27/2017 Type of visit - description : acute Interval history: States that since he had a colonoscopy February 2019 "my stomach is not the same".  Reports that he frequently feels bloated and feels gassy at different areas in the abdomen, mostly at the epigastric and left lower quadrant areas. Occasionally has constipation and diarrhea.  Also, GERD was previously well controlled with OTC Nexium but for the last 2 weeks symptoms have increased.  Review of Systems Appetite remains very good, no weight loss No nausea or vomiting No dysphagia or odynophagia Does see traces of red blood on the stools after a hard BM.  Past Medical History:  Diagnosis Date  . Allergic rhinitis   . Anxiety   . DVT (deep venous thrombosis) (HCC)    RLE 2002 aprox, (-) anticoag panel  . GERD (gastroesophageal reflux disease)   . OSA on CPAP   . Palpitations    on betablokers    Past Surgical History:  Procedure Laterality Date  . NASAL SEPTOPLASTY W/ TURBINOPLASTY    . TONSILLECTOMY      Social History   Socioeconomic History  . Marital status: Married    Spouse name: Not on file  . Number of children: 1  . Years of education: Not on file  . Highest education level: Not on file  Occupational History  . Occupation: Catering managerconsulting work  Engineer, productionocial Needs  . Financial resource strain: Not on file  . Food insecurity:    Worry: Not on file    Inability: Not on file  . Transportation needs:    Medical: Not on file    Non-medical: Not on file  Tobacco Use  . Smoking status: Never Smoker  . Smokeless tobacco: Never Used  Substance and Sexual Activity  . Alcohol use: Yes    Comment: rare  . Drug use: No  . Sexual activity: Not on file  Lifestyle  . Physical activity:    Days per week: Not on file    Minutes per session: Not on file  . Stress: Not on file  Relationships  . Social  connections:    Talks on phone: Not on file    Gets together: Not on file    Attends religious service: Not on file    Active member of club or organization: Not on file    Attends meetings of clubs or organizations: Not on file    Relationship status: Not on file  . Intimate partner violence:    Fear of current or ex partner: Not on file    Emotionally abused: Not on file    Physically abused: Not on file    Forced sexual activity: Not on file  Other Topics Concern  . Not on file  Social History Narrative   Lives w/ wife   Child born 2000, still at home    Father in law live w/ them   Lost father 01-2014               Allergies as of 07/27/2017   No Known Allergies     Medication List        Accurate as of 07/27/17  6:35 PM. Always use your most recent med list.          dicyclomine 20 MG tablet Commonly known as:  BENTYL Take 1 tablet (20 mg total) by  mouth 3 (three) times daily before meals.   GOODY HEADACHE PO Take 1 packet by mouth daily as needed (for pain).   metoprolol succinate 25 MG 24 hr tablet Commonly known as:  TOPROL-XL Take 1 tablet (25 mg total) by mouth daily.   MULTIVITAMIN PO Take 1 tablet by mouth daily.   NON FORMULARY Inject 1 Dose as directed once a week. Allergy Shot   pantoprazole 40 MG tablet Commonly known as:  PROTONIX Take 1 tablet (40 mg total) by mouth daily.          Objective:   Physical Exam BP 104/70 (BP Location: Left Arm, Patient Position: Sitting, Cuff Size: Large)   Pulse (!) 58   Temp 97.9 F (36.6 C) (Oral)   Resp 16   Ht 6\' 3"  (1.905 m)   Wt 225 lb 3.2 oz (102.2 kg)   SpO2 96%   BMI 28.15 kg/m  General:   Well developed, well nourished . NAD.  HEENT:  Normocephalic . Face symmetric, atraumatic Lungs:  CTA B Normal respiratory effort, no intercostal retractions, no accessory muscle use. Heart: RRR,  no murmur.  No pretibial edema bilaterally  Abdomen: Soft, nontender, nondistended. Skin: Not  pale. Not jaundice Neurologic:  alert & oriented X3.  Speech normal, gait appropriate for age and unassisted Psych--  Cognition and judgment appear intact.  Cooperative with normal attention span and concentration.  Behavior appropriate. No anxious or depressed appearing.      Assessment & Plan:   Assessment Anxiety  Palpitations on bb qd OSA, on CPAP GERD Allergic rhinitis, weekly shots, Dr Eloy Callas DVT, RLE 2002 , (-)  coagulation panel  PLAN: GERD: Well-controlled until 2 weeks ago, currently on Nexium 20 mg daily.  Take this in the morning.  Plan: Change to pantoprazole 40, call if not improving.diet discussed  Irregular bowel movements: Symptoms started after normal colonoscopy 03-2017.  Unclear etiology, he does see traces of red blood with hard BMs but otherwise no fever, chills or weight loss.  Recommend probiotics, trial with Bentyl, call if not gradually improving.

## 2017-07-29 DIAGNOSIS — J3089 Other allergic rhinitis: Secondary | ICD-10-CM | POA: Diagnosis not present

## 2017-07-29 DIAGNOSIS — J301 Allergic rhinitis due to pollen: Secondary | ICD-10-CM | POA: Diagnosis not present

## 2017-08-10 DIAGNOSIS — J3089 Other allergic rhinitis: Secondary | ICD-10-CM | POA: Diagnosis not present

## 2017-08-10 DIAGNOSIS — J301 Allergic rhinitis due to pollen: Secondary | ICD-10-CM | POA: Diagnosis not present

## 2017-08-10 DIAGNOSIS — J3081 Allergic rhinitis due to animal (cat) (dog) hair and dander: Secondary | ICD-10-CM | POA: Diagnosis not present

## 2017-08-10 DIAGNOSIS — H1045 Other chronic allergic conjunctivitis: Secondary | ICD-10-CM | POA: Diagnosis not present

## 2017-09-02 DIAGNOSIS — J301 Allergic rhinitis due to pollen: Secondary | ICD-10-CM | POA: Diagnosis not present

## 2017-09-02 DIAGNOSIS — J3089 Other allergic rhinitis: Secondary | ICD-10-CM | POA: Diagnosis not present

## 2017-09-03 DIAGNOSIS — J3089 Other allergic rhinitis: Secondary | ICD-10-CM | POA: Diagnosis not present

## 2017-09-23 DIAGNOSIS — J301 Allergic rhinitis due to pollen: Secondary | ICD-10-CM | POA: Diagnosis not present

## 2017-09-23 DIAGNOSIS — J3089 Other allergic rhinitis: Secondary | ICD-10-CM | POA: Diagnosis not present

## 2017-09-28 ENCOUNTER — Other Ambulatory Visit: Payer: Self-pay | Admitting: Internal Medicine

## 2017-09-30 DIAGNOSIS — J3089 Other allergic rhinitis: Secondary | ICD-10-CM | POA: Diagnosis not present

## 2017-09-30 DIAGNOSIS — J301 Allergic rhinitis due to pollen: Secondary | ICD-10-CM | POA: Diagnosis not present

## 2017-10-02 ENCOUNTER — Telehealth: Payer: Self-pay | Admitting: *Deleted

## 2017-10-02 NOTE — Telephone Encounter (Signed)
Received Physician Results Form from [[employer], need waist circumference; will forward to provider upon RTO on 10/07/17/SLS 08/16

## 2017-10-06 NOTE — Telephone Encounter (Signed)
Form signed by PCP on return to office. LMOM informing Pt's wife Annice PihJackie that form is ready to be faxed however we need Pt's waist circumference (pant size). Awaiting call back.

## 2017-10-08 DIAGNOSIS — J301 Allergic rhinitis due to pollen: Secondary | ICD-10-CM | POA: Diagnosis not present

## 2017-10-08 DIAGNOSIS — J3089 Other allergic rhinitis: Secondary | ICD-10-CM | POA: Diagnosis not present

## 2017-10-08 NOTE — Telephone Encounter (Signed)
Patients [facility] size.  36"

## 2017-10-09 NOTE — Telephone Encounter (Signed)
Form updated and will be faxed by paperwork CMA.

## 2017-10-21 ENCOUNTER — Other Ambulatory Visit: Payer: Self-pay | Admitting: Internal Medicine

## 2017-10-21 DIAGNOSIS — J3089 Other allergic rhinitis: Secondary | ICD-10-CM | POA: Diagnosis not present

## 2017-10-21 DIAGNOSIS — J301 Allergic rhinitis due to pollen: Secondary | ICD-10-CM | POA: Diagnosis not present

## 2017-10-22 DIAGNOSIS — N400 Enlarged prostate without lower urinary tract symptoms: Secondary | ICD-10-CM | POA: Diagnosis not present

## 2017-10-28 DIAGNOSIS — J3089 Other allergic rhinitis: Secondary | ICD-10-CM | POA: Diagnosis not present

## 2017-10-28 DIAGNOSIS — J301 Allergic rhinitis due to pollen: Secondary | ICD-10-CM | POA: Diagnosis not present

## 2017-10-29 DIAGNOSIS — R350 Frequency of micturition: Secondary | ICD-10-CM | POA: Diagnosis not present

## 2017-11-04 DIAGNOSIS — R1013 Epigastric pain: Secondary | ICD-10-CM | POA: Diagnosis not present

## 2017-11-12 DIAGNOSIS — R1013 Epigastric pain: Secondary | ICD-10-CM | POA: Diagnosis not present

## 2017-11-12 DIAGNOSIS — K2980 Duodenitis without bleeding: Secondary | ICD-10-CM | POA: Diagnosis not present

## 2017-11-12 DIAGNOSIS — Z86010 Personal history of colonic polyps: Secondary | ICD-10-CM | POA: Diagnosis not present

## 2017-11-12 DIAGNOSIS — D131 Benign neoplasm of stomach: Secondary | ICD-10-CM | POA: Diagnosis not present

## 2017-11-13 DIAGNOSIS — J301 Allergic rhinitis due to pollen: Secondary | ICD-10-CM | POA: Diagnosis not present

## 2017-11-13 DIAGNOSIS — R1013 Epigastric pain: Secondary | ICD-10-CM | POA: Diagnosis not present

## 2017-11-13 DIAGNOSIS — J3089 Other allergic rhinitis: Secondary | ICD-10-CM | POA: Diagnosis not present

## 2017-11-23 ENCOUNTER — Ambulatory Visit: Payer: BLUE CROSS/BLUE SHIELD | Admitting: Pulmonary Disease

## 2017-11-23 ENCOUNTER — Encounter: Payer: Self-pay | Admitting: Pulmonary Disease

## 2017-11-23 DIAGNOSIS — J309 Allergic rhinitis, unspecified: Secondary | ICD-10-CM | POA: Diagnosis not present

## 2017-11-23 DIAGNOSIS — G4733 Obstructive sleep apnea (adult) (pediatric): Secondary | ICD-10-CM | POA: Diagnosis not present

## 2017-11-23 NOTE — Progress Notes (Addendum)
@Patient  ID: Ronnie [name], male    DOB: 1960/11/13, 57 y.o.   MRN: [id number]  Chief Complaint  Patient presents with  . Follow-up    OSA follow up     Referring provider: Wanda Plump, MD  HPI:  57 y.o. male never smoker followed in our office for obstructive sleep apnea.  PMH:  Smoker/ Smoking History: Never Smoker  Maintenance:   Pt of: Dr. Craige Cotta  11/23/2017  - Visit   56 year old male patient presenting today for 1 year obstructive sleep apnea follow-up.  Patient reports no current issues or complaints with using CPAP.  Patient CPAP compliance report today showing 30 out of 30 days use.  With 28 of those days greater than 4 hours.  Average usage 5 hours and 8 minutes.  APAP settings 5-12, AHI 2.7.  Epworth score today is 5.  Patient reports that he notices a considerable difference when he does not use his CPAP to sleep at night.  Patient reports he cannot sleep without it.  Patient is interested in a so clean CPAP cleaning device.  Patient is morning with our experience has been with them or if we have patients that currently used on the like for him.  Patient is also inquiring about inspire implantable obstructive sleep apnea device.  Patient is unsure if he is a candidate.  Patient reports that may be more convenient as he travels often for work.    Chart Review:   Tests:  PSG 04/10/03 >> AHI 43    Specialty Problems      Pulmonary Problems   ALLERGIC RHINITIS    Qualifier: Diagnosis of  By: Drue Novel MD, Jose E.       OSA (obstructive sleep apnea)    NPSG 2005:  AHI 43/hr  11/23/2017-CPAP compliance report-30 out of 30 days use, 28 of those days greater than 4 hours, average usage 5 hours and 8 minutes, APAP settings 5-12, AHI 2.7         No Known Allergies  Immunization History  Administered Date(s) Administered  . Td 03/23/2003  . Tdap 05/12/2013   >>> Patient refused flu vaccine today patient reports he never received the flu vaccine but does not plan  to start now  Past Medical History:  Diagnosis Date  . Allergic rhinitis   . Anxiety   . DVT (deep venous thrombosis) (HCC)    RLE 2002 aprox, (-) anticoag panel  . GERD (gastroesophageal reflux disease)   . OSA on CPAP   . Palpitations    on betablokers    Tobacco History: Social History   Tobacco Use  Smoking Status Never Smoker  Smokeless Tobacco Never Used   Counseling given: Yes  Continue not smoking  Outpatient Encounter Medications as of 11/23/2017  Medication Sig  . metoprolol succinate (TOPROL-XL) 25 MG 24 hr tablet Take 1 tablet (25 mg total) by mouth daily. (Patient taking differently: Take 25 mg by mouth daily. Three times a week)  . Multiple Vitamins-Minerals (MULTIVITAMIN PO) Take 1 tablet by mouth daily.  . NON FORMULARY Inject 1 Dose as directed once a week. Allergy Shot  . Oregano 1500 MG CAPS Take 1,500 mg by mouth daily.  . pantoprazole (PROTONIX) 40 MG tablet Take 1 tablet (40 mg total) by mouth daily.  . Aspirin-Acetaminophen-Caffeine (GOODY HEADACHE PO) Take 1 packet by mouth daily as needed (for pain).  Marland Kitchen dicyclomine (BENTYL) 20 MG tablet Take 1 tablet (20 mg total) by mouth 3 (three) times daily  before meals. (Patient not taking: Reported on 11/23/2017)   No facility-administered encounter medications on file as of 11/23/2017.      Review of Systems  Review of Systems  Constitutional: Negative for activity change, chills, fatigue, fever and unexpected weight change.  HENT: Positive for congestion. Negative for postnasal drip, rhinorrhea, sinus pressure, sinus pain, sneezing and sore throat.   Eyes: Negative.   Respiratory: Negative for cough, shortness of breath and wheezing.   Cardiovascular: Negative for chest pain and palpitations.  Gastrointestinal: Negative for constipation, diarrhea, nausea and vomiting.  Endocrine: Negative.   Musculoskeletal: Negative.   Skin: Negative.   Neurological: Negative for dizziness and headaches.    Psychiatric/Behavioral: Negative.  Negative for dysphoric mood. The patient is not nervous/anxious.   All other systems reviewed and are negative.   Results of the Epworth flowsheet 11/23/2017  Sitting and reading 0  Watching TV 1  Sitting, inactive in a public place (e.g. a theatre or a meeting) 0  As a passenger in a car for an hour without a break 0  Lying down to rest in the afternoon when circumstances permit 2  Sitting and talking to someone 0  Sitting quietly after a lunch without alcohol 2  In a car, while stopped for a few minutes in traffic 0  Total score 5     Physical Exam  BP 122/72 (BP Location: Left Arm, Cuff Size: Normal)   Pulse (!) 57   Ht 6\' 3"  (1.905 m)   Wt 225 lb 6.4 oz (102.2 kg)   SpO2 98%   BMI 28.17 kg/m   Wt Readings from Last 5 Encounters:  11/23/17 225 lb 6.4 oz (102.2 kg)  07/27/17 225 lb 3.2 oz (102.2 kg)  06/08/17 226 lb (102.5 kg)  11/10/16 214 lb 6.4 oz (97.3 kg)  09/29/16 219 lb 6 oz (99.5 kg)    Physical Exam  Constitutional: He is oriented to person, place, and time and well-developed, well-nourished, and in no distress. No distress.  HENT:  Head: Normocephalic and atraumatic.  Right Ear: Hearing, tympanic membrane, external ear and ear canal normal.  Left Ear: Hearing, tympanic membrane, external ear and ear canal normal.  Nose: Mucosal edema and rhinorrhea present.  Mouth/Throat: Uvula is midline and oropharynx is clear and moist. No oropharyngeal exudate.  +mallampati II  Eyes: Pupils are equal, round, and reactive to light.  Neck: Normal range of motion. Neck supple. No JVD present.  Cardiovascular: Normal rate, regular rhythm and normal heart sounds.  Pulmonary/Chest: Effort normal and breath sounds normal. No accessory muscle usage. No respiratory distress. He has no decreased breath sounds. He has no wheezes. He has no rhonchi.  Musculoskeletal: Normal range of motion. He exhibits no edema.  Lymphadenopathy:    He has no  cervical adenopathy.  Neurological: He is alert and oriented to person, place, and time. Gait normal.  Skin: Skin is warm and dry. He is not diaphoretic. No erythema.  Psychiatric: Mood, memory, affect and judgment normal.  Nursing note and vitals reviewed.     Lab Results:  CBC    Component Value Date/Time   WBC 3.6 (L) 06/08/2017 0933   RBC 4.36 06/08/2017 0933   HGB 13.4 06/08/2017 0933   HCT 40.7 06/08/2017 0933   PLT 153.0 06/08/2017 0933   MCV 93.5 06/08/2017 0933   MCHC 32.9 06/08/2017 0933   RDW 12.8 06/08/2017 0933   LYMPHSABS 1.1 06/08/2017 0933   MONOABS 0.5 06/08/2017 0933   EOSABS 0.0  06/08/2017 0933   BASOSABS 0.0 06/08/2017 0933    BMET    Component Value Date/Time   NA 140 06/08/2017 0933   K 3.8 06/08/2017 0933   CL 107 06/08/2017 0933   CO2 25 06/08/2017 0933   GLUCOSE 92 06/08/2017 0933   BUN 11 06/08/2017 0933   CREATININE 0.95 06/08/2017 0933   CALCIUM 9.3 06/08/2017 0933   GFRNONAA 103.37 03/20/2009 0821   GFRAA 117 03/02/2008 1028    BNP No results found for: BNP  ProBNP No results found for: PROBNP    Assessment & Plan:   Pleasant 57 year old patient seen for follow-up visit today.  Patient is doing well on CPAP therapy.  Patient to continue CPAP therapy.  Will place prescription for reorder supplies.  Patient to follow-up in 1 year.  Patient can proceed forward with purchasing a so clean device if he would like.  Patient would not like referral to see Dr. Jenne Pane for evaluation for inspire device.  I agree.  Encouraged patient to try to achieve at least 6 hours of sleep at night.  Patient refused flu vaccine today.  OSA (obstructive sleep apnea) We recommend that you continue using your CPAP daily >>>Keep up the hard work using your device >>> Goal should be wearing this for the entire night that you are sleeping, at least 4 to 6 hours  Remember:  . Do not drive or operate heavy machinery if tired or drowsy.  . Please notify the  supply company and office if you are unable to use your device regularly due to missing supplies or machine being broken.  . Work on maintaining a healthy weight and following your recommended nutrition plan  . Maintain proper daily exercise and movement  . Maintaining proper use of your device can also help improve management of other chronic illnesses such as: Blood pressure, blood sugars, and weight management.   BiPAP/ CPAP Cleaning:  >>>Clean weekly, with Dawn soap, and bottle brush.  Set up to air dry.  Follow in one year   You do not need a prescription for So Clean Device. Just purchase via telephone or on the website.     This appointment was 26 minutes along with her 50% of the time in direct face-to-face patient care, assessment, plan of care follow-up  Coral Ceo, NP 11/23/2017

## 2017-11-23 NOTE — Patient Instructions (Addendum)
We recommend that you continue using your CPAP daily >>>Keep up the hard work using your device >>> Goal should be wearing this for the entire night that you are sleeping, at least 4 to 6 hours  Remember:  . Do not drive or operate heavy machinery if tired or drowsy.  . Please notify the supply company and office if you are unable to use your device regularly due to missing supplies or machine being broken.  . Work on maintaining a healthy weight and following your recommended nutrition plan  . Maintain proper daily exercise and movement  . Maintaining proper use of your device can also help improve management of other chronic illnesses such as: Blood pressure, blood sugars, and weight management.   BiPAP/ CPAP Cleaning:  >>>Clean weekly, with Dawn soap, and bottle brush.  Set up to air dry.  Follow in one year   You do not need a prescription for So Clean Device. Just purchase via telephone or on the website.   It is flu season:   >>>Remember to be washing your hands regularly, using hand sanitizer, be careful to use around herself with has contact with people who are sick will increase her chances of getting sick yourself. >>> Best ways to protect herself from the flu: Receive the yearly flu vaccine, practice good hand hygiene washing with soap and also using hand sanitizer when available, eat a nutritious meals, get adequate rest, hydrate appropriately   Please contact the office if your symptoms worsen or you have concerns that you are not improving.   Thank you for choosing [facility] for your healthcare, and for allowing [place] to partner with you on your healthcare journey. I am thankful to be able to provide care to you today.   Elisha Headland FNP-C      CPAP and BiPAP Information CPAP and BiPAP are methods of helping a person breathe with the use of air pressure. CPAP stands for "continuous positive airway pressure." BiPAP stands for "bi-level positive airway pressure."  In both methods, air is blown through your nose or mouth and into your air passages to help you breathe well. CPAP and BiPAP use different amounts of pressure to blow air. With CPAP, the amount of pressure stays the same while you breathe in and out. With BiPAP, the amount of pressure is increased when you breathe in (inhale) so that you can take larger breaths. Your health care provider will recommend whether CPAP or BiPAP would be more helpful for you. Why are CPAP and BiPAP treatments used? CPAP or BiPAP can be helpful if you have:  Sleep apnea.  Chronic obstructive pulmonary disease (COPD).  Heart failure.  Medical conditions that weaken the muscles of the chest including muscular dystrophy, or neurological diseases such as amyotrophic lateral sclerosis (ALS).  Other problems that cause breathing to be weak, abnormal, or difficult.  CPAP is most commonly used for obstructive sleep apnea (OSA) to keep the airways from collapsing when the muscles relax during sleep. How is CPAP or BiPAP administered? Both CPAP and BiPAP are provided by a small machine with a flexible plastic tube that attaches to a plastic mask. You wear the mask. Air is blown through the mask into your nose or mouth. The amount of pressure that is used to blow the air can be adjusted on the machine. Your health care provider will determine the pressure setting that should be used based on your individual needs. When should CPAP or BiPAP be used?  In most cases, the mask only needs to be worn during sleep. Generally, the mask needs to be worn throughout the night and during any daytime naps. People with certain medical conditions may also need to wear the mask at other times when they are awake. Follow instructions from your health care provider about when to use the machine. What are some tips for using the mask?  Because the mask needs to be snug, some people feel trapped or closed-in (claustrophobic) when first using the  mask. If you feel this way, you may need to get used to the mask. One way to do this is by holding the mask loosely over your nose or mouth and then gradually applying the mask more snugly. You can also gradually increase the amount of time that you use the mask.  Masks are available in various types and sizes. Some fit over your mouth and nose while others fit over just your nose. If your mask does not fit well, talk with your health care provider about getting a different one.  If you are using a mask that fits over your nose and you tend to breathe through your mouth, a chin strap may be applied to help keep your mouth closed.  The CPAP and BiPAP machines have alarms that may sound if the mask comes off or develops a leak.  If you have trouble with the mask, it is very important that you talk with your health care provider about finding a way to make the mask easier to tolerate. Do not stop using the mask. Stopping the use of the mask could have a negative impact on your health. What are some tips for using the machine?  Place your CPAP or BiPAP machine on a secure table or stand near an electrical outlet.  Know where the on/off switch is located on the machine.  Follow instructions from your health care provider about how to set the pressure on your machine and when you should use it.  Do not eat or drink while the CPAP or BiPAP machine is on. Food or fluids could get pushed into your lungs by the pressure of the CPAP or BiPAP.  Do not smoke. Tobacco smoke residue can damage the machine.  For home use, CPAP and BiPAP machines can be rented or purchased through home health care companies. Many different brands of machines are available. Renting a machine before purchasing may help you find out which particular machine works well for you.  Keep the CPAP or BiPAP machine and attachments clean. Ask your health care provider for specific instructions. Get help right away if:  You have  redness or open areas around your nose or mouth where the mask fits.  You have trouble using the CPAP or BiPAP machine.  You cannot tolerate wearing the CPAP or BiPAP mask.  You have pain, discomfort, and bloating in your abdomen. Summary  CPAP and BiPAP are methods of helping a person breathe with the use of air pressure.  Both CPAP and BiPAP are provided by a small machine with a flexible plastic tube that attaches to a plastic mask.  If you have trouble with the mask, it is very important that you talk with your health care provider about finding a way to make the mask easier to tolerate. This information is not intended to replace advice given to you by your health care provider. Make sure you discuss any questions you have with your health care provider. Document Released: 11/02/2003  Document Revised: 12/24/2015 Document Reviewed: 12/24/2015 Elsevier Interactive Patient Education  2017 [employer].

## 2017-11-23 NOTE — Assessment & Plan Note (Addendum)
Can continue Zyrtec as needed >>> May need to use as seasons transition, as she reported that symptoms tend to flareup from winter to spring and summer to fall Can add Flonase 1 spray each nostril daily as needed for nasal congestion and allergy-like symptoms

## 2017-11-23 NOTE — Assessment & Plan Note (Signed)
We recommend that you continue using your CPAP daily >>>Keep up the hard work using your device >>> Goal should be wearing this for the entire night that you are sleeping, at least 4 to 6 hours  Remember:  . Do not drive or operate heavy machinery if tired or drowsy.  . Please notify the supply company and office if you are unable to use your device regularly due to missing supplies or machine being broken.  . Work on maintaining a healthy weight and following your recommended nutrition plan  . Maintain proper daily exercise and movement  . Maintaining proper use of your device can also help improve management of other chronic illnesses such as: Blood pressure, blood sugars, and weight management.   BiPAP/ CPAP Cleaning:  >>>Clean weekly, with Dawn soap, and bottle brush.  Set up to air dry.  Follow in one year   You do not need a prescription for So Clean Device. Just purchase via telephone or on the website.

## 2017-11-23 NOTE — Addendum Note (Signed)
Addended by: Coral Ceo on: 11/23/2017 09:34 PM   Modules accepted: Level of Service

## 2017-11-24 NOTE — Progress Notes (Signed)
Reviewed and agree with assessment/plan.   Vineet Sood, MD [facility] 02/13/2016, 12:24 PM Pager:  [phone]  

## 2017-12-02 DIAGNOSIS — J301 Allergic rhinitis due to pollen: Secondary | ICD-10-CM | POA: Diagnosis not present

## 2017-12-02 DIAGNOSIS — J3089 Other allergic rhinitis: Secondary | ICD-10-CM | POA: Diagnosis not present

## 2017-12-16 DIAGNOSIS — J3089 Other allergic rhinitis: Secondary | ICD-10-CM | POA: Diagnosis not present

## 2017-12-16 DIAGNOSIS — J301 Allergic rhinitis due to pollen: Secondary | ICD-10-CM | POA: Diagnosis not present

## 2017-12-22 DIAGNOSIS — G4733 Obstructive sleep apnea (adult) (pediatric): Secondary | ICD-10-CM | POA: Diagnosis not present

## 2017-12-23 DIAGNOSIS — J301 Allergic rhinitis due to pollen: Secondary | ICD-10-CM | POA: Diagnosis not present

## 2017-12-23 DIAGNOSIS — J3089 Other allergic rhinitis: Secondary | ICD-10-CM | POA: Diagnosis not present

## 2018-01-12 DIAGNOSIS — J301 Allergic rhinitis due to pollen: Secondary | ICD-10-CM | POA: Diagnosis not present

## 2018-01-12 DIAGNOSIS — J3089 Other allergic rhinitis: Secondary | ICD-10-CM | POA: Diagnosis not present

## 2018-01-27 DIAGNOSIS — J3089 Other allergic rhinitis: Secondary | ICD-10-CM | POA: Diagnosis not present

## 2018-01-27 DIAGNOSIS — J301 Allergic rhinitis due to pollen: Secondary | ICD-10-CM | POA: Diagnosis not present

## 2018-02-03 DIAGNOSIS — J3089 Other allergic rhinitis: Secondary | ICD-10-CM | POA: Diagnosis not present

## 2018-02-03 DIAGNOSIS — J301 Allergic rhinitis due to pollen: Secondary | ICD-10-CM | POA: Diagnosis not present

## 2018-02-24 DIAGNOSIS — J3089 Other allergic rhinitis: Secondary | ICD-10-CM | POA: Diagnosis not present

## 2018-02-24 DIAGNOSIS — J301 Allergic rhinitis due to pollen: Secondary | ICD-10-CM | POA: Diagnosis not present

## 2018-03-03 DIAGNOSIS — J3089 Other allergic rhinitis: Secondary | ICD-10-CM | POA: Diagnosis not present

## 2018-03-03 DIAGNOSIS — J301 Allergic rhinitis due to pollen: Secondary | ICD-10-CM | POA: Diagnosis not present

## 2018-03-10 DIAGNOSIS — J301 Allergic rhinitis due to pollen: Secondary | ICD-10-CM | POA: Diagnosis not present

## 2018-03-10 DIAGNOSIS — J3089 Other allergic rhinitis: Secondary | ICD-10-CM | POA: Diagnosis not present

## 2018-03-31 DIAGNOSIS — J3089 Other allergic rhinitis: Secondary | ICD-10-CM | POA: Diagnosis not present

## 2018-03-31 DIAGNOSIS — J301 Allergic rhinitis due to pollen: Secondary | ICD-10-CM | POA: Diagnosis not present

## 2018-04-14 DIAGNOSIS — J301 Allergic rhinitis due to pollen: Secondary | ICD-10-CM | POA: Diagnosis not present

## 2018-04-14 DIAGNOSIS — J3089 Other allergic rhinitis: Secondary | ICD-10-CM | POA: Diagnosis not present

## 2018-04-21 DIAGNOSIS — J3089 Other allergic rhinitis: Secondary | ICD-10-CM | POA: Diagnosis not present

## 2018-04-21 DIAGNOSIS — J301 Allergic rhinitis due to pollen: Secondary | ICD-10-CM | POA: Diagnosis not present

## 2018-05-05 DIAGNOSIS — J3089 Other allergic rhinitis: Secondary | ICD-10-CM | POA: Diagnosis not present

## 2018-05-05 DIAGNOSIS — J301 Allergic rhinitis due to pollen: Secondary | ICD-10-CM | POA: Diagnosis not present

## 2018-05-11 ENCOUNTER — Telehealth: Payer: Self-pay | Admitting: Pulmonary Disease

## 2018-05-11 DIAGNOSIS — G4733 Obstructive sleep apnea (adult) (pediatric): Secondary | ICD-10-CM

## 2018-05-11 DIAGNOSIS — Z9989 Dependence on other enabling machines and devices: Principal | ICD-10-CM

## 2018-05-11 NOTE — Telephone Encounter (Signed)
DME: [facility] Pressure 5-12 cmh20  Pt feels like he is getting to much pressure. He has a lot of head congestion when he wakes up. Please advise on whether pressure needs to be changed.

## 2018-05-11 NOTE — Telephone Encounter (Signed)
Called patient and advised of Dr. Evlyn Courier response.  Order sent to [facility] to for setting change.  Patient voiced understanding. Nothing further needed at this time.

## 2018-05-11 NOTE — Telephone Encounter (Signed)
Can change CPAP to 5 -10 cm H2O.  If he still has trouble, then he can try using flonase one spray in each nostril nightly before going to bed as needed.

## 2018-05-13 DIAGNOSIS — J3089 Other allergic rhinitis: Secondary | ICD-10-CM | POA: Diagnosis not present

## 2018-05-13 DIAGNOSIS — J301 Allergic rhinitis due to pollen: Secondary | ICD-10-CM | POA: Diagnosis not present

## 2018-05-19 DIAGNOSIS — J301 Allergic rhinitis due to pollen: Secondary | ICD-10-CM | POA: Diagnosis not present

## 2018-05-19 DIAGNOSIS — J3089 Other allergic rhinitis: Secondary | ICD-10-CM | POA: Diagnosis not present

## 2018-06-02 DIAGNOSIS — J301 Allergic rhinitis due to pollen: Secondary | ICD-10-CM | POA: Diagnosis not present

## 2018-06-02 DIAGNOSIS — J3089 Other allergic rhinitis: Secondary | ICD-10-CM | POA: Diagnosis not present

## 2018-06-07 DIAGNOSIS — J301 Allergic rhinitis due to pollen: Secondary | ICD-10-CM | POA: Diagnosis not present

## 2018-06-08 DIAGNOSIS — J3089 Other allergic rhinitis: Secondary | ICD-10-CM | POA: Diagnosis not present

## 2018-06-11 ENCOUNTER — Ambulatory Visit (INDEPENDENT_AMBULATORY_CARE_PROVIDER_SITE_OTHER): Payer: BLUE CROSS/BLUE SHIELD | Admitting: Internal Medicine

## 2018-06-11 ENCOUNTER — Other Ambulatory Visit: Payer: Self-pay

## 2018-06-11 ENCOUNTER — Encounter: Payer: Self-pay | Admitting: Internal Medicine

## 2018-06-11 DIAGNOSIS — Z0000 Encounter for general adult medical examination without abnormal findings: Secondary | ICD-10-CM | POA: Diagnosis not present

## 2018-06-11 DIAGNOSIS — Z09 Encounter for follow-up examination after completed treatment for conditions other than malignant neoplasm: Secondary | ICD-10-CM

## 2018-06-11 NOTE — Assessment & Plan Note (Signed)
--  Td 2015 --CCS: +FH Cscope 10-08 per pt (2 polyps) cscope 11-2011 cscope 03/2017 , neg, next 5 years (Dr Candelaria Stagers) --PSAs at [facility]  Urology per pt, last OV ~ 82019 per pt,   no documentation , reports PSA ok  --Discussed diet-exercise, trying to eat healthy and stay active, started yoga --Will come back for labs: CMP, FLP, CBC, TSH RTC 1 year

## 2018-06-11 NOTE — Progress Notes (Signed)
Subjective:    Patient ID: Ronnie [name], male    DOB: May 03, 1960, 58 y.o.   MRN: [id number]  DOS:  06/11/2018 Type of visit - description: Attempted  to make this a video visit, due to technical difficulties from the patient side it was not possible  thus we proceeded with a Virtual Visit via Telephone    I connected with@ on 06/11/18 at  8:00 AM EDT by telephone and verified that I am speaking with the correct person using two identifiers.  THIS ENCOUNTER IS A VIRTUAL VISIT DUE TO COVID-19 - PATIENT WAS NOT SEEN IN THE OFFICE. PATIENT HAS CONSENTED TO VIRTUAL VISIT / TELEMEDICINE VISIT   Location of patient: home  Location of provider: office  I discussed the limitations, risks, security and privacy concerns of performing an evaluation and management service by telephone and the availability of in person appointments. I also discussed with the patient that there may be a patient responsible charge related to this service. The patient expressed understanding and agreed to proceed.  History of Present Illness: Physical exam Since the last time I saw him he is doing well. No major concerns  Review of Systems 14 point review of system is negative, specifically denies chest pain, difficulty breathing.  No nausea, vomiting, diarrhea. No cough or chest pain. No dysuria or gross hematuria He did admit to some allergy symptoms, better with Claritin.  Past Medical History:  Diagnosis Date  . Allergic rhinitis   . Anxiety   . DVT (deep venous thrombosis) (HCC)    RLE 2002 aprox, (-) anticoag panel  . GERD (gastroesophageal reflux disease)   . OSA on CPAP   . Palpitations    on betablokers    Past Surgical History:  Procedure Laterality Date  . NASAL SEPTOPLASTY W/ TURBINOPLASTY    . TONSILLECTOMY      Social History   Socioeconomic History  . Marital status: Married    Spouse name: Not on file  . Number of children: 1  . Years of education: Not on file  . Highest  education level: Not on file  Occupational History  . Occupation: Catering managerconsulting work  Engineer, productionocial Needs  . Financial resource strain: Not on file  . Food insecurity:    Worry: Not on file    Inability: Not on file  . Transportation needs:    Medical: Not on file    Non-medical: Not on file  Tobacco Use  . Smoking status: Never Smoker  . Smokeless tobacco: Never Used  Substance and Sexual Activity  . Alcohol use: Yes    Comment: rare  . Drug use: No  . Sexual activity: Not on file  Lifestyle  . Physical activity:    Days per week: Not on file    Minutes per session: Not on file  . Stress: Not on file  Relationships  . Social connections:    Talks on phone: Not on file    Gets together: Not on file    Attends religious service: Not on file    Active member of club or organization: Not on file    Attends meetings of clubs or organizations: Not on file    Relationship status: Not on file  . Intimate partner violence:    Fear of current or ex partner: Not on file    Emotionally abused: Not on file    Physically abused: Not on file    Forced sexual activity: Not on file  Other Topics Concern  .  Not on file  Social History Narrative   Lives w/ wife   Child born 2000, still at home    Father in law live w/ them   Lost father 01-2014              Family History  Problem Relation Age of Onset  . Colon cancer Father 48          . Prostate cancer Father 38  . Diabetes Neg Hx   . Heart attack Neg Hx   . Stroke Neg Hx      Allergies as of 06/11/2018   No Known Allergies     Medication List       Accurate as of June 11, 2018  1:21 PM. Always use your most recent med list.        dicyclomine 20 MG tablet Commonly known as:  Bentyl Take 1 tablet (20 mg total) by mouth 3 (three) times daily before meals.   GOODY HEADACHE PO Take 1 packet by mouth daily as needed (for pain).   loratadine 10 MG tablet Commonly known as:  CLARITIN Take 10 mg by mouth daily.    metoprolol succinate 25 MG 24 hr tablet Commonly known as:  TOPROL-XL Take 1 tablet (25 mg total) by mouth daily.   MULTIVITAMIN PO Take 1 tablet by mouth daily.   NON FORMULARY Inject 1 Dose as directed once a week. Allergy Shot   Oregano 1500 MG Caps Take 1,500 mg by mouth daily.   pantoprazole 40 MG tablet Commonly known as:  PROTONIX Take 1 tablet (40 mg total) by mouth daily.           Objective:   Physical Exam There were no vitals taken for this visit. This was phone visit, alert oriented x3, no apparent distress BP check by pulmonary 11-2017: 122/72    Assessment     Assessment Anxiety  Palpitations on bb qd OSA, on CPAP GERD Allergic rhinitis, weekly shots, Dr Lane Callas DVT, RLE 2002 , (-)  coagulation panel  PLAN: Anxiety: Currently doing well.  In the midst of this coronavirus pandemia, his wife and him have are doing okay, they are still working. Palpitations: On and off, on beta-blockers as needed OSA: Last visit with pulmonology 11-2017.  No change suggested GERD: Good compliance with PPIs.  Last visit with GI 11/13/2017, ultrasound was done, per report:  Mild distal abdominal aortic ectasia  Allergies: On Claritin, symptoms relatively well controlled Will arrange for labs RTC 1 year     I discussed the assessment and treatment plan with the patient. The patient was provided an opportunity to ask questions and all were answered. The patient agreed with the plan and demonstrated an understanding of the instructions.   The patient was advised to call back or seek an in-person evaluation if the symptoms worsen or if the condition fails to improve as anticipated.  I provided 22 minutes of non-face-to-face time during this encounter.  Willow Ora, MD

## 2018-06-11 NOTE — Assessment & Plan Note (Signed)
Anxiety: Currently doing well.  In the midst of this coronavirus pandemia, his wife and him have are doing okay, they are still working. Palpitations: On and off, on beta-blockers as needed OSA: Last visit with pulmonology 11-2017.  No change suggested GERD: Good compliance with PPIs.  Last visit with GI 11/13/2017, ultrasound was done, per report:  Mild distal abdominal aortic ectasia  Allergies: On Claritin, symptoms relatively well controlled Will arrange for labs RTC 1 year

## 2018-06-14 ENCOUNTER — Other Ambulatory Visit: Payer: BLUE CROSS/BLUE SHIELD

## 2018-06-17 ENCOUNTER — Other Ambulatory Visit: Payer: Self-pay

## 2018-06-17 ENCOUNTER — Other Ambulatory Visit (INDEPENDENT_AMBULATORY_CARE_PROVIDER_SITE_OTHER): Payer: BLUE CROSS/BLUE SHIELD

## 2018-06-17 DIAGNOSIS — Z0000 Encounter for general adult medical examination without abnormal findings: Secondary | ICD-10-CM

## 2018-06-17 LAB — CBC WITH DIFFERENTIAL/PLATELET
Basophils Absolute: 0 10*3/uL (ref 0.0–0.1)
Basophils Relative: 0.7 % (ref 0.0–3.0)
Eosinophils Absolute: 0.1 10*3/uL (ref 0.0–0.7)
Eosinophils Relative: 1.7 % (ref 0.0–5.0)
HCT: 41.3 % (ref 39.0–52.0)
Hemoglobin: 13.6 g/dL (ref 13.0–17.0)
Lymphocytes Relative: 35.9 % (ref 12.0–46.0)
Lymphs Abs: 1.5 10*3/uL (ref 0.7–4.0)
MCHC: 32.9 g/dL (ref 30.0–36.0)
MCV: 94.2 fl (ref 78.0–100.0)
Monocytes Absolute: 0.5 10*3/uL (ref 0.1–1.0)
Monocytes Relative: 12.5 % — ABNORMAL HIGH (ref 3.0–12.0)
Neutro Abs: 2 10*3/uL (ref 1.4–7.7)
Neutrophils Relative %: 49.2 % (ref 43.0–77.0)
Platelets: 160 10*3/uL (ref 150.0–400.0)
RBC: 4.39 Mil/uL (ref 4.22–5.81)
RDW: 12.8 % (ref 11.5–15.5)
WBC: 4.1 10*3/uL (ref 4.0–10.5)

## 2018-06-17 LAB — COMPREHENSIVE METABOLIC PANEL
ALT: 19 U/L (ref 0–53)
AST: 21 U/L (ref 0–37)
Albumin: 4.3 g/dL (ref 3.5–5.2)
Alkaline Phosphatase: 61 U/L (ref 39–117)
BUN: 10 mg/dL (ref 6–23)
CO2: 28 mEq/L (ref 19–32)
Calcium: 9.2 mg/dL (ref 8.4–10.5)
Chloride: 105 mEq/L (ref 96–112)
Creatinine, Ser: 1 mg/dL (ref 0.40–1.50)
GFR: 93.72 mL/min (ref >60.00)
Glucose, Bld: 88 mg/dL (ref 70–99)
Potassium: 4 mEq/L (ref 3.5–5.1)
Sodium: 141 mEq/L (ref 135–145)
Total Bilirubin: 0.9 mg/dL (ref 0.2–1.2)
Total Protein: 7 g/dL (ref 6.0–8.3)

## 2018-06-17 LAB — LIPID PANEL
Cholesterol: 136 mg/dL (ref 0–200)
HDL: 40.2 mg/dL (ref >39.00)
LDL Cholesterol: 73 mg/dL (ref 0–99)
NonHDL: 95.33
Total CHOL/HDL Ratio: 3
Triglycerides: 114 mg/dL (ref 0.0–149.0)
VLDL: 22.8 mg/dL (ref 0.0–40.0)

## 2018-06-17 LAB — TSH: TSH: 3.34 u[IU]/mL (ref 0.35–4.50)

## 2018-06-23 DIAGNOSIS — J3089 Other allergic rhinitis: Secondary | ICD-10-CM | POA: Diagnosis not present

## 2018-06-23 DIAGNOSIS — J301 Allergic rhinitis due to pollen: Secondary | ICD-10-CM | POA: Diagnosis not present

## 2018-07-07 DIAGNOSIS — J3089 Other allergic rhinitis: Secondary | ICD-10-CM | POA: Diagnosis not present

## 2018-07-07 DIAGNOSIS — J301 Allergic rhinitis due to pollen: Secondary | ICD-10-CM | POA: Diagnosis not present

## 2018-07-21 DIAGNOSIS — J3089 Other allergic rhinitis: Secondary | ICD-10-CM | POA: Diagnosis not present

## 2018-07-21 DIAGNOSIS — J301 Allergic rhinitis due to pollen: Secondary | ICD-10-CM | POA: Diagnosis not present

## 2018-07-26 DIAGNOSIS — G4733 Obstructive sleep apnea (adult) (pediatric): Secondary | ICD-10-CM | POA: Diagnosis not present

## 2018-08-04 DIAGNOSIS — J301 Allergic rhinitis due to pollen: Secondary | ICD-10-CM | POA: Diagnosis not present

## 2018-08-04 DIAGNOSIS — J3089 Other allergic rhinitis: Secondary | ICD-10-CM | POA: Diagnosis not present

## 2018-08-11 DIAGNOSIS — J3081 Allergic rhinitis due to animal (cat) (dog) hair and dander: Secondary | ICD-10-CM | POA: Diagnosis not present

## 2018-08-11 DIAGNOSIS — J3089 Other allergic rhinitis: Secondary | ICD-10-CM | POA: Diagnosis not present

## 2018-08-11 DIAGNOSIS — H1045 Other chronic allergic conjunctivitis: Secondary | ICD-10-CM | POA: Diagnosis not present

## 2018-08-11 DIAGNOSIS — J301 Allergic rhinitis due to pollen: Secondary | ICD-10-CM | POA: Diagnosis not present

## 2018-09-01 DIAGNOSIS — J301 Allergic rhinitis due to pollen: Secondary | ICD-10-CM | POA: Diagnosis not present

## 2018-09-01 DIAGNOSIS — J3089 Other allergic rhinitis: Secondary | ICD-10-CM | POA: Diagnosis not present

## 2018-09-15 DIAGNOSIS — J301 Allergic rhinitis due to pollen: Secondary | ICD-10-CM | POA: Diagnosis not present

## 2018-09-15 DIAGNOSIS — J3089 Other allergic rhinitis: Secondary | ICD-10-CM | POA: Diagnosis not present

## 2018-09-22 DIAGNOSIS — J3089 Other allergic rhinitis: Secondary | ICD-10-CM | POA: Diagnosis not present

## 2018-09-22 DIAGNOSIS — J301 Allergic rhinitis due to pollen: Secondary | ICD-10-CM | POA: Diagnosis not present

## 2018-09-29 DIAGNOSIS — J3089 Other allergic rhinitis: Secondary | ICD-10-CM | POA: Diagnosis not present

## 2018-09-29 DIAGNOSIS — J301 Allergic rhinitis due to pollen: Secondary | ICD-10-CM | POA: Diagnosis not present

## 2018-10-07 ENCOUNTER — Telehealth: Payer: Self-pay | Admitting: Internal Medicine

## 2018-10-07 DIAGNOSIS — J3089 Other allergic rhinitis: Secondary | ICD-10-CM | POA: Diagnosis not present

## 2018-10-07 DIAGNOSIS — J301 Allergic rhinitis due to pollen: Secondary | ICD-10-CM | POA: Diagnosis not present

## 2018-10-07 NOTE — Telephone Encounter (Signed)
Form completed- placed in MD red folder for signature

## 2018-10-07 NOTE — Telephone Encounter (Signed)
Patient came into the office to drop off a form to be completed for his insurance. Placed in provider tray. Patient also requests copy of last lab results for his records. Please advise. Patient stated the due date is Friday, August 28th.

## 2018-10-08 NOTE — Telephone Encounter (Signed)
signed

## 2018-10-08 NOTE — Telephone Encounter (Signed)
Form faxed to [employer] [facility] at [phone], copy of form sent for scanning- original mailed back to Pt w/ last lab results as requested.

## 2018-10-13 DIAGNOSIS — J301 Allergic rhinitis due to pollen: Secondary | ICD-10-CM | POA: Diagnosis not present

## 2018-10-13 DIAGNOSIS — J3089 Other allergic rhinitis: Secondary | ICD-10-CM | POA: Diagnosis not present

## 2018-10-18 ENCOUNTER — Other Ambulatory Visit: Payer: Self-pay | Admitting: Internal Medicine

## 2018-10-20 DIAGNOSIS — J3089 Other allergic rhinitis: Secondary | ICD-10-CM | POA: Diagnosis not present

## 2018-10-20 DIAGNOSIS — J301 Allergic rhinitis due to pollen: Secondary | ICD-10-CM | POA: Diagnosis not present

## 2018-10-27 DIAGNOSIS — R350 Frequency of micturition: Secondary | ICD-10-CM | POA: Diagnosis not present

## 2018-10-27 DIAGNOSIS — N401 Enlarged prostate with lower urinary tract symptoms: Secondary | ICD-10-CM | POA: Diagnosis not present

## 2018-11-02 DIAGNOSIS — R350 Frequency of micturition: Secondary | ICD-10-CM | POA: Diagnosis not present

## 2018-11-02 DIAGNOSIS — N401 Enlarged prostate with lower urinary tract symptoms: Secondary | ICD-10-CM | POA: Diagnosis not present

## 2018-11-03 DIAGNOSIS — J301 Allergic rhinitis due to pollen: Secondary | ICD-10-CM | POA: Diagnosis not present

## 2018-11-03 DIAGNOSIS — J3089 Other allergic rhinitis: Secondary | ICD-10-CM | POA: Diagnosis not present

## 2018-11-11 DIAGNOSIS — J301 Allergic rhinitis due to pollen: Secondary | ICD-10-CM | POA: Diagnosis not present

## 2018-11-11 DIAGNOSIS — J3089 Other allergic rhinitis: Secondary | ICD-10-CM | POA: Diagnosis not present

## 2018-11-15 ENCOUNTER — Ambulatory Visit: Payer: BC Managed Care – PPO | Admitting: Pulmonary Disease

## 2018-11-15 ENCOUNTER — Other Ambulatory Visit: Payer: Self-pay

## 2018-11-15 ENCOUNTER — Encounter: Payer: Self-pay | Admitting: Pulmonary Disease

## 2018-11-15 VITALS — BP 128/78 | HR 62 | Temp 97.2°F | Ht 75.0 in | Wt 234.6 lb

## 2018-11-15 DIAGNOSIS — G4733 Obstructive sleep apnea (adult) (pediatric): Secondary | ICD-10-CM

## 2018-11-15 DIAGNOSIS — Z9989 Dependence on other enabling machines and devices: Secondary | ICD-10-CM

## 2018-11-15 NOTE — Patient Instructions (Signed)
Follow up in 1 year.

## 2018-11-15 NOTE — Progress Notes (Signed)
[facility]  Chief Complaint  Patient presents with  . OSA (obstructive sleep apnea) on CPAP    Constitutional:  BP 128/78 (BP Location: Right Arm, Patient Position: Sitting, Cuff Size: Normal)   Pulse 62   Temp (!) 97.2 F (36.2 C)   Ht 6\' 3"  (1.905 m)   Wt 234 lb 9.6 oz (106.4 kg)   SpO2 98% Comment: on room air  BMI 29.32 kg/m   Past Medical History:  GERD, DVT 2002, Anxiety, Palpitations, s/p UPPP  Brief Summary:  Ronnie [name] is a 58 y.o. male with obstructive sleep apnea.  He called in March to have CPAP adjusted.  Changed to auto CPAP 5 to 10 cm H2O.  He has gained about 20 lbs since I last saw him in 2018.  He has been cleaning his machine by hand.  He usually does this once per week.  He is questioning whether using a cleaning device would help.  He uses [facility].  Physical Exam:   Appearance - well kempt   ENMT - clear nasal mucosa, midline nasal  septum, no oral exudates, no LAN, trachea midline, s/p UPPP  Respiratory - normal chest wall, normal respiratory effort, no accessory muscle use, no wheeze/rales  CV - s1s2 regular rate and rhythm, no murmurs, no peripheral edema, radial pulses symmetric  GI - soft, non tender, no masses  Lymph - no adenopathy noted in neck and axillary areas  MSK - normal gait  Ext - no cyanosis, clubbing, or joint inflammation noted  Skin - no rashes, lesions, or ulcers  Neuro - normal strength, oriented x 3  Psych - normal mood and affect   Assessment/Plan:   Obstructive sleep apnea. - he is compliant with CPAP and reports benefit - continue auto CPAP 5 to 10 cm H2O - discussed different cleaning options for his equipment - discussed when he should be eligible for replacement supplies   Patient Instructions  Follow up in 1 year   A total of  16 minutes were spent face to face with the patient and more than half of that time involved counseling or  coordination of care.   2019, MD [facility] Pager: [phone] 11/15/2018, 9:23 AM  Flow Sheet    Sleep tests:  PSG 04/10/03 >> AHI 43 Auto CPAP 10/13/18 to 11/11/18 >> used on 29 of 30 nights with average 5 hrs.  Average AHI 2.4 with median CPAP 6 and 95 th percentile CPAP 9 cm H2O.  Medications:   Allergies as of 11/15/2018   No Known Allergies     Medication List       Accurate as of November 15, 2018  9:23 AM. If you have any questions, ask your nurse or doctor.        dicyclomine 20 MG tablet Commonly known as: Bentyl Take 1 tablet (20 mg total) by mouth 3 (three) times daily before meals.   GOODY HEADACHE PO Take 1 packet by mouth daily as needed (for pain).   loratadine 10 MG tablet Commonly known as: CLARITIN Take 10 mg by mouth daily.   metoprolol succinate 25 MG 24 hr tablet Commonly known as: TOPROL-XL Take 1 tablet (25 mg total) by mouth daily. What changed: additional instructions   MULTIVITAMIN PO Take 1 tablet by mouth daily.   NON FORMULARY Inject 1 Dose as directed once a week. Allergy Shot   Oregano 1500 MG Caps Take 1,500 mg by mouth  daily.   pantoprazole 40 MG tablet Commonly known as: PROTONIX Take 1 tablet (40 mg total) by mouth daily before breakfast.       Past Surgical History:  He  has a past surgical history that includes Tonsillectomy and Nasal septoplasty w/ turbinoplasty.  Family History:  His family history includes Colon cancer (age of onset: 32) in his father; Prostate cancer (age of onset: 3) in his father.  Social History:  He  reports that he has never smoked. He has never used smokeless tobacco. He reports current alcohol use. He reports that he does not use drugs.

## 2018-11-17 DIAGNOSIS — J301 Allergic rhinitis due to pollen: Secondary | ICD-10-CM | POA: Diagnosis not present

## 2018-11-17 DIAGNOSIS — J3089 Other allergic rhinitis: Secondary | ICD-10-CM | POA: Diagnosis not present

## 2018-11-24 DIAGNOSIS — J3089 Other allergic rhinitis: Secondary | ICD-10-CM | POA: Diagnosis not present

## 2018-11-24 DIAGNOSIS — J301 Allergic rhinitis due to pollen: Secondary | ICD-10-CM | POA: Diagnosis not present

## 2018-12-01 DIAGNOSIS — J301 Allergic rhinitis due to pollen: Secondary | ICD-10-CM | POA: Diagnosis not present

## 2018-12-01 DIAGNOSIS — J3089 Other allergic rhinitis: Secondary | ICD-10-CM | POA: Diagnosis not present

## 2018-12-15 DIAGNOSIS — J301 Allergic rhinitis due to pollen: Secondary | ICD-10-CM | POA: Diagnosis not present

## 2018-12-15 DIAGNOSIS — J3089 Other allergic rhinitis: Secondary | ICD-10-CM | POA: Diagnosis not present

## 2018-12-22 DIAGNOSIS — J301 Allergic rhinitis due to pollen: Secondary | ICD-10-CM | POA: Diagnosis not present

## 2018-12-22 DIAGNOSIS — J3089 Other allergic rhinitis: Secondary | ICD-10-CM | POA: Diagnosis not present

## 2018-12-29 DIAGNOSIS — J301 Allergic rhinitis due to pollen: Secondary | ICD-10-CM | POA: Diagnosis not present

## 2018-12-29 DIAGNOSIS — J3089 Other allergic rhinitis: Secondary | ICD-10-CM | POA: Diagnosis not present

## 2019-01-14 ENCOUNTER — Telehealth: Payer: Self-pay | Admitting: Internal Medicine

## 2019-01-17 NOTE — Telephone Encounter (Signed)
Please advise on metoprolol sig.  

## 2019-01-17 NOTE — Telephone Encounter (Signed)
1 tablet daily as needed for palpitations, Rx sent

## 2019-01-18 ENCOUNTER — Other Ambulatory Visit: Payer: Self-pay

## 2019-01-18 ENCOUNTER — Encounter: Payer: Self-pay | Admitting: Medical

## 2019-01-18 ENCOUNTER — Ambulatory Visit (INDEPENDENT_AMBULATORY_CARE_PROVIDER_SITE_OTHER): Payer: BC Managed Care – PPO | Admitting: Medical

## 2019-01-18 VITALS — Temp 97.7°F

## 2019-01-18 DIAGNOSIS — R059 Cough, unspecified: Secondary | ICD-10-CM

## 2019-01-18 DIAGNOSIS — Z20822 Contact with and (suspected) exposure to covid-19: Secondary | ICD-10-CM

## 2019-01-18 DIAGNOSIS — M2550 Pain in unspecified joint: Secondary | ICD-10-CM

## 2019-01-18 DIAGNOSIS — R05 Cough: Secondary | ICD-10-CM

## 2019-01-18 DIAGNOSIS — J329 Chronic sinusitis, unspecified: Secondary | ICD-10-CM

## 2019-01-18 MED ORDER — BENZONATATE 100 MG PO CAPS: 100.0000 mg | ORAL_CAPSULE | Freq: Three times a day (TID) | ORAL | 0 refills | Status: DC | PRN

## 2019-01-18 MED ORDER — DOXYCYCLINE HYCLATE 100 MG PO TABS: 100.0000 mg | ORAL_TABLET | Freq: Two times a day (BID) | ORAL | 0 refills | Status: DC

## 2019-01-18 MED ORDER — FLUTICASONE PROPIONATE 50 MCG/ACT NA SUSP: 2.0000 | Freq: Every day | NASAL | 1 refills | Status: DC

## 2019-01-18 NOTE — Progress Notes (Signed)
   Subjective:    Patient ID: Ronnie [name], male    DOB: [birth year], 58 y.o.   MRN: [id number]  HPI  Virtual Visit via Telephone Note  I connected with Lazaro Arms on 01/18/19 at  9:40 AM EST by telephone and verified that I am speaking with the correct person using two identifiers.  Location: Patient: home Provider: office   I discussed the limitations, risks, security and privacy concerns of performing an evaluation and management service by telephone and the availability of in person appointments. I also discussed with the patient that there may be a patient responsible charge related to this service. The patient expressed understanding and agreed to proceed.   Pt has no bp cuff or O2 sat monitor.   History of Present Illness:  Pt states some sinus pressure today, some decreased appetite, mild low energy, joint aches and low grade fever.   Signs and symptoms started on Friday.   Pt state temp yesterday was 99.  No loss of smell. Taste some decreased.   Pt does get some sinus infection 1-2 times a year.     Observations/Objective:  General- no acute distress. Pleasant, oriented normal speech.   Assessment and Plan: You do have some signs/symptoms that might represent sinus infection but your recent diffuse myalgias causes it include Covid infection in the differential diagnosis.  Will prescribe doxycycline antibiotic, Flonase nasal spray and benzonatate for cough.  Do want you to go ahead and go to the Covid drive-through test center.  I went ahead and typed out work excuse note.  Try to send it through my chart but it looks like my chart not established.  So ask [place] assistant you call patient and fax patient's note.  Counseled patient on quarantining pending Covid test results as well as well as assessment to treatment regimen.  Follow-up date to be determined after Covid test result review.  Mackie Pai, PA-C  Follow Up Instructions:    I  discussed the assessment and treatment plan with the patient. The patient was provided an opportunity to ask questions and all were answered. The patient agreed with the plan and demonstrated an understanding of the instructions.   The patient was advised to call back or seek an in-person evaluation if the symptoms worsen or if the condition fails to improve as anticipated.  I provided 15  minutes of non-face-to-face time during this encounter.   Mackie Pai, PA-C   Review of Systems     Objective:   Physical Exam        Assessment & Plan:

## 2019-01-18 NOTE — Patient Instructions (Signed)
You do have some signs/symptoms that might represent sinus infection but your recent diffuse myalgias causes it include Covid infection in the differential diagnosis.  Will prescribe doxycycline antibiotic, Flonase nasal spray and benzonatate for cough.  Do want you to go ahead and go to the Covid drive-through test center.  I went ahead and typed out work excuse note.  Try to send it through my chart but it looks like my chart not established.  So ask [place] assistant you call patient and fax patient's note.  Counseled patient on quarantining pending Covid test results as well as well as assessment to treatment regimen.  Follow-up date to be determined after Covid test result review.

## 2019-01-20 LAB — NOVEL CORONAVIRUS, NAA: SARS-CoV-2, NAA: DETECTED — AB

## 2019-01-27 ENCOUNTER — Other Ambulatory Visit: Payer: Self-pay

## 2019-01-27 ENCOUNTER — Ambulatory Visit (INDEPENDENT_AMBULATORY_CARE_PROVIDER_SITE_OTHER): Payer: BC Managed Care – PPO | Admitting: Family Medicine

## 2019-01-27 ENCOUNTER — Encounter: Payer: Self-pay | Admitting: Family Medicine

## 2019-01-27 DIAGNOSIS — R0989 Other specified symptoms and signs involving the circulatory and respiratory systems: Secondary | ICD-10-CM

## 2019-01-27 DIAGNOSIS — U071 COVID-19: Secondary | ICD-10-CM | POA: Diagnosis not present

## 2019-01-27 MED ORDER — PREDNISONE 20 MG PO TABS: ORAL_TABLET | ORAL | 0 refills | Status: DC

## 2019-01-27 NOTE — Progress Notes (Signed)
[facility] [address] [place], [place] [address] [phone]  Date:  01/27/2019   Name:  Ronnie [name]   DOB:  10-11-1960   MRN:  [id number]  PCP:  Wanda Plump, MD    Chief Complaint: No chief complaint on file.   History of Present Illness:  Ronnie [name] is a 57 y.o. very pleasant male patient who presents with the following:  Patient of my partner Dr. Drue Novel, history of sleep apnea, DVT in 2012, palpitations Virtual visit today for concern of illness Patient location is home, provider is at office I confirmed the patient's identity with 2 factors, he gives consent for virtual visit today The patient and myself are present on the phone call  Today he notes head/ nasal congestion for the last couple of days He had stopped using his CPAP about a week ago - he was seen by Ramon Dredge on December 1, he rx doxycycline for possible bacterial infection but he tested positive for covid that day He did continue to use the doxycycline and is now finishing up the course  Overall his covid sx are better- he has more energy, no fevers the last 5 days  No history of DM  His main concern now is that he would like to get back on his CPAP machine, but he is waking up with chest and nasal congestion after couple of hours.  He is not sure what to do about this.  He notes that his energy level and overall mood is much better when he does use his CPAP  No history of CHF, no CHF symptoms   Patient Active Problem List   Diagnosis Date Noted  . PCP NOTES >>>>>>>>>>>>>> 05/18/2015  . Annual physical exam 04/11/2011  . ANXIETY 02/26/2007  . PALPITATIONS 02/26/2007  . GERD 11/05/2006  . Allergic rhinitis 07/09/2006  . OSA (obstructive sleep apnea) 07/09/2006    Past Medical History:  Diagnosis Date  . Allergic rhinitis   . Anxiety   . DVT (deep venous thrombosis) (HCC)    RLE 2002 aprox, (-) anticoag panel  . GERD  (gastroesophageal reflux disease)   . OSA on CPAP   . Palpitations    on betablokers    Past Surgical History:  Procedure Laterality Date  . NASAL SEPTOPLASTY W/ TURBINOPLASTY    . TONSILLECTOMY      Social History   Tobacco Use  . Smoking status: Never Smoker  . Smokeless tobacco: Never Used  Substance Use Topics  . Alcohol use: Yes    Comment: rare  . Drug use: No    Family History  Problem Relation Age of Onset  . Colon cancer Father 17          . Prostate cancer Father 73  . Diabetes Neg Hx   . Heart attack Neg Hx   . Stroke Neg Hx     No Known Allergies  Medication list has been reviewed and updated.  Current Outpatient Medications on File Prior to Visit  Medication Sig Dispense Refill  . Aspirin-Acetaminophen-Caffeine (GOODY HEADACHE PO) Take 1 packet by mouth daily as needed (for pain).    . benzonatate (TESSALON) 100 MG capsule Take 1 capsule (100 mg total) by mouth 3 (three) times daily as needed for cough. 30 capsule 0  . dicyclomine (BENTYL) 20 MG tablet Take 1 tablet (20 mg total) by mouth 3 (three) times daily before meals. 30 tablet  0  . doxycycline (VIBRA-TABS) 100 MG tablet Take 1 tablet (100 mg total) by mouth 2 (two) times daily. 20 tablet 0  . fluticasone (FLONASE) 50 MCG/ACT nasal spray Place 2 sprays into both nostrils daily. 16 g 1  . loratadine (CLARITIN) 10 MG tablet Take 10 mg by mouth daily.    . metoprolol succinate (TOPROL-XL) 25 MG 24 hr tablet 1 tablet daily as needed for palpitations 90 tablet 2  . Multiple Vitamins-Minerals (MULTIVITAMIN PO) Take 1 tablet by mouth daily.    . NON FORMULARY Inject 1 Dose as directed once a week. Allergy Shot    . Oregano 1500 MG CAPS Take 1,500 mg by mouth daily.    . pantoprazole (PROTONIX) 40 MG tablet Take 1 tablet (40 mg total) by mouth daily before breakfast. 90 tablet 3   No current facility-administered medications on file prior to visit.    Review of Systems:  No leg swelling or unexpected  weight gain  No current fever chills Physical Examination: There were no vitals filed for this visit. There were no vitals filed for this visit. There is no height or weight on file to calculate BMI. Ideal Body Weight:    He is checking his temp at home, normal Oxygen level 95- 100% at home  Spoke with patient on the phone, he sounds well.  No cough, wheezing, distress or shortness of breath is noted  Assessment and Plan: COVID-19 - Plan: predniSONE (DELTASONE) 20 MG tablet  Chest congestion - Plan: predniSONE (DELTASONE) 20 MG tablet  Virtual visit today for patient who recently tested positive for COVID-19, 10 days ago.  He is overall feeling much better, but notes chest and nasal congestion especially at night.  This makes it hard for him to use a CPAP machine. I suggested that he try nasal decongestant spray such as Afrin for a few days.  We will also prescribe a course of prednisone to use for chest congestion, discussed how to use this medication  He is asked to follow-up with me if not feeling better in the next few days, sooner if worse He states understanding and agreement Spoke with patient for 8 minutes on the telephone today Spoke to pt   Signed Lamar Blinks, MD

## 2019-02-02 ENCOUNTER — Other Ambulatory Visit: Payer: Self-pay

## 2019-02-02 ENCOUNTER — Ambulatory Visit (INDEPENDENT_AMBULATORY_CARE_PROVIDER_SITE_OTHER): Payer: BC Managed Care – PPO

## 2019-02-02 ENCOUNTER — Ambulatory Visit: Payer: BLUE CROSS/BLUE SHIELD | Admitting: Podiatry

## 2019-02-02 ENCOUNTER — Encounter: Payer: Self-pay | Admitting: Podiatry

## 2019-02-02 DIAGNOSIS — M722 Plantar fascial fibromatosis: Secondary | ICD-10-CM

## 2019-02-02 DIAGNOSIS — M84374A Stress fracture, right foot, initial encounter for fracture: Secondary | ICD-10-CM | POA: Diagnosis not present

## 2019-02-02 NOTE — Progress Notes (Signed)
.  vg 

## 2019-02-02 NOTE — Progress Notes (Signed)
Subjective:   Patient ID: Ronnie [name], male   DOB: 58 y.o.   MRN: [id number]   HPI Patient states having a lot of pain in the right heel and I have developed a lot of pain in the right forefoot with swelling and concerned about what this could be it almost feels like him walking on a broken bone   ROS      Objective:  Physical Exam  Neurovascular status intact with exquisite discomfort right plantar fascia at the insertion tendon calcaneus with inflammation swelling around the second metatarsal distal shaft neck     Assessment:  Acute plantar fasciitis right with possibility for stress fracture right foot     Plan:  Sterile prep injected the plantar fascia right 3 mg Kenalog 5 mg Xylocaine and discussed possibility for stress fracture and reviewed stress fracture with patient.  At this point dispensed air fracture walker and patient will be seen back to recheck and does understand the probability of stress fracture right foot from compensatory gait pattern  X-rays indicate that there is no signs of fracture of the right second metatarsal but it is very early and they appear this time moves all

## 2019-02-07 DIAGNOSIS — R091 Pleurisy: Secondary | ICD-10-CM | POA: Diagnosis not present

## 2019-02-23 ENCOUNTER — Ambulatory Visit: Payer: BC Managed Care – PPO | Admitting: Podiatry

## 2019-02-23 DIAGNOSIS — J301 Allergic rhinitis due to pollen: Secondary | ICD-10-CM | POA: Diagnosis not present

## 2019-02-23 DIAGNOSIS — J3089 Other allergic rhinitis: Secondary | ICD-10-CM | POA: Diagnosis not present

## 2019-02-24 ENCOUNTER — Ambulatory Visit: Payer: BC Managed Care – PPO | Admitting: Podiatry

## 2019-02-24 ENCOUNTER — Other Ambulatory Visit: Payer: Self-pay

## 2019-02-24 ENCOUNTER — Encounter: Payer: Self-pay | Admitting: Podiatry

## 2019-02-24 DIAGNOSIS — M84374A Stress fracture, right foot, initial encounter for fracture: Secondary | ICD-10-CM | POA: Diagnosis not present

## 2019-02-24 DIAGNOSIS — M722 Plantar fascial fibromatosis: Secondary | ICD-10-CM

## 2019-02-25 NOTE — Progress Notes (Signed)
Subjective:   Patient ID: Ronnie [name], male   DOB: 59 y.o.   MRN: [id number]   HPI Patient states my foot is feeling a lot better with diminished discomfort and not as much swelling on top of my foot   ROS      Objective:  Physical Exam  Neurovascular status intact with significant diminishment of discomfort in the plantar fascial right with the dorsal foot improved with mild discomfort only upon deep palpation     Assessment:  Stress fracture right that is improving with plantar fasciitis also improving     Plan:  Advised on the continuation of boot usage anti-inflammatories physical therapy and patient will be seen back to recheck

## 2019-03-01 DIAGNOSIS — J301 Allergic rhinitis due to pollen: Secondary | ICD-10-CM | POA: Diagnosis not present

## 2019-03-01 DIAGNOSIS — J3089 Other allergic rhinitis: Secondary | ICD-10-CM | POA: Diagnosis not present

## 2019-03-09 DIAGNOSIS — J301 Allergic rhinitis due to pollen: Secondary | ICD-10-CM | POA: Diagnosis not present

## 2019-03-09 DIAGNOSIS — J3089 Other allergic rhinitis: Secondary | ICD-10-CM | POA: Diagnosis not present

## 2019-03-16 DIAGNOSIS — J3089 Other allergic rhinitis: Secondary | ICD-10-CM | POA: Diagnosis not present

## 2019-03-16 DIAGNOSIS — J301 Allergic rhinitis due to pollen: Secondary | ICD-10-CM | POA: Diagnosis not present

## 2019-03-16 DIAGNOSIS — G4733 Obstructive sleep apnea (adult) (pediatric): Secondary | ICD-10-CM | POA: Diagnosis not present

## 2019-03-23 DIAGNOSIS — J3089 Other allergic rhinitis: Secondary | ICD-10-CM | POA: Diagnosis not present

## 2019-03-23 DIAGNOSIS — J301 Allergic rhinitis due to pollen: Secondary | ICD-10-CM | POA: Diagnosis not present

## 2019-03-25 DIAGNOSIS — K5900 Constipation, unspecified: Secondary | ICD-10-CM | POA: Diagnosis not present

## 2019-03-30 DIAGNOSIS — J301 Allergic rhinitis due to pollen: Secondary | ICD-10-CM | POA: Diagnosis not present

## 2019-03-30 DIAGNOSIS — J3089 Other allergic rhinitis: Secondary | ICD-10-CM | POA: Diagnosis not present

## 2019-04-20 DIAGNOSIS — J3089 Other allergic rhinitis: Secondary | ICD-10-CM | POA: Diagnosis not present

## 2019-04-20 DIAGNOSIS — J301 Allergic rhinitis due to pollen: Secondary | ICD-10-CM | POA: Diagnosis not present

## 2019-04-21 DIAGNOSIS — J301 Allergic rhinitis due to pollen: Secondary | ICD-10-CM | POA: Diagnosis not present

## 2019-04-22 DIAGNOSIS — J3089 Other allergic rhinitis: Secondary | ICD-10-CM | POA: Diagnosis not present

## 2019-04-27 DIAGNOSIS — J3089 Other allergic rhinitis: Secondary | ICD-10-CM | POA: Diagnosis not present

## 2019-04-27 DIAGNOSIS — J301 Allergic rhinitis due to pollen: Secondary | ICD-10-CM | POA: Diagnosis not present

## 2019-05-18 DIAGNOSIS — J301 Allergic rhinitis due to pollen: Secondary | ICD-10-CM | POA: Diagnosis not present

## 2019-05-18 DIAGNOSIS — J3089 Other allergic rhinitis: Secondary | ICD-10-CM | POA: Diagnosis not present

## 2019-06-08 DIAGNOSIS — J301 Allergic rhinitis due to pollen: Secondary | ICD-10-CM | POA: Diagnosis not present

## 2019-06-08 DIAGNOSIS — J3089 Other allergic rhinitis: Secondary | ICD-10-CM | POA: Diagnosis not present

## 2019-06-09 ENCOUNTER — Other Ambulatory Visit: Payer: Self-pay

## 2019-06-14 ENCOUNTER — Ambulatory Visit (INDEPENDENT_AMBULATORY_CARE_PROVIDER_SITE_OTHER): Payer: BC Managed Care – PPO | Admitting: Internal Medicine

## 2019-06-14 ENCOUNTER — Encounter: Payer: Self-pay | Admitting: Internal Medicine

## 2019-06-14 ENCOUNTER — Other Ambulatory Visit: Payer: Self-pay

## 2019-06-14 VITALS — BP 113/63 | HR 58 | Temp 96.6°F | Resp 16 | Ht 75.0 in | Wt 229.4 lb

## 2019-06-14 DIAGNOSIS — Z0000 Encounter for general adult medical examination without abnormal findings: Secondary | ICD-10-CM | POA: Diagnosis not present

## 2019-06-14 DIAGNOSIS — G4733 Obstructive sleep apnea (adult) (pediatric): Secondary | ICD-10-CM | POA: Diagnosis not present

## 2019-06-14 LAB — CBC WITH DIFFERENTIAL/PLATELET
Basophils Absolute: 0 10*3/uL (ref 0.0–0.1)
Basophils Relative: 0.4 % (ref 0.0–3.0)
Eosinophils Absolute: 0.1 10*3/uL (ref 0.0–0.7)
Eosinophils Relative: 1.6 % (ref 0.0–5.0)
HCT: 39.9 % (ref 39.0–52.0)
Hemoglobin: 13 g/dL (ref 13.0–17.0)
Lymphocytes Relative: 37.8 % (ref 12.0–46.0)
Lymphs Abs: 1.5 10*3/uL (ref 0.7–4.0)
MCHC: 32.5 g/dL (ref 30.0–36.0)
MCV: 93.7 fl (ref 78.0–100.0)
Monocytes Absolute: 0.4 10*3/uL (ref 0.1–1.0)
Monocytes Relative: 10.8 % (ref 3.0–12.0)
Neutro Abs: 2 10*3/uL (ref 1.4–7.7)
Neutrophils Relative %: 49.4 % (ref 43.0–77.0)
Platelets: 175 10*3/uL (ref 150.0–400.0)
RBC: 4.26 Mil/uL (ref 4.22–5.81)
RDW: 13.2 % (ref 11.5–15.5)
WBC: 4 10*3/uL (ref 4.0–10.5)

## 2019-06-14 LAB — LIPID PANEL
Cholesterol: 120 mg/dL (ref 0–200)
HDL: 39.1 mg/dL (ref >39.00)
LDL Cholesterol: 65 mg/dL (ref 0–99)
NonHDL: 80.9
Total CHOL/HDL Ratio: 3
Triglycerides: 80 mg/dL (ref 0.0–149.0)
VLDL: 16 mg/dL (ref 0.0–40.0)

## 2019-06-14 LAB — COMPREHENSIVE METABOLIC PANEL
ALT: 17 U/L (ref 0–53)
AST: 19 U/L (ref 0–37)
Albumin: 4.2 g/dL (ref 3.5–5.2)
Alkaline Phosphatase: 67 U/L (ref 39–117)
BUN: 11 mg/dL (ref 6–23)
CO2: 27 mEq/L (ref 19–32)
Calcium: 9 mg/dL (ref 8.4–10.5)
Chloride: 106 mEq/L (ref 96–112)
Creatinine, Ser: 0.96 mg/dL (ref 0.40–1.50)
GFR: 97.89 mL/min (ref >60.00)
Glucose, Bld: 88 mg/dL (ref 70–99)
Potassium: 4.2 mEq/L (ref 3.5–5.1)
Sodium: 141 mEq/L (ref 135–145)
Total Bilirubin: 0.7 mg/dL (ref 0.2–1.2)
Total Protein: 7 g/dL (ref 6.0–8.3)

## 2019-06-14 LAB — TSH: TSH: 1.39 u[IU]/mL (ref 0.35–4.50)

## 2019-06-14 NOTE — Progress Notes (Signed)
Pre visit review using our clinic review tool, if applicable. No additional management support is needed unless otherwise documented below in the visit note. 

## 2019-06-14 NOTE — Patient Instructions (Addendum)
  GO TO THE LAB : Get the blood work     GO TO THE FRONT DESK, PLEASE SCHEDULE YOUR APPOINTMENTS Come back for for a physical exam in 1 year 

## 2019-06-14 NOTE — Assessment & Plan Note (Signed)
--  Td 2015 -S/p Covid vaccinations --CCS: +FH Cscope 10-08 per pt (2 polyps) cscope 11-2011 cscope 03/2017 , neg, next 5 years (Dr Candelaria Stagers) --PSAs at [facility]  Urology per pt, last OV ~ 82019 per pt,   no documentation, PSA was 0.3 on 10/27/2018 (per [facility]) -- Diet-exercise: Doing well, very active, exercises, weightlifting. -Labs: CMP, FLP, CBC and TSH.  Check blood type per patient request

## 2019-06-14 NOTE — Progress Notes (Signed)
Subjective:    Patient ID: Ronnie [name], male    DOB: [birth year], 59 y.o.   MRN: [id number]  DOS:  06/14/2019 Type of visit - description: CPX Has few concerns Right elbow seems larger than the left, no pain.  He is very active lifting weights. Has a skin lesion, left elbow, unchanged for years Has a lump on the midline of the abdomen.  No pain  Wt Readings from Last 3 Encounters:  06/14/19 229 lb 6 oz (104 kg)  11/15/18 234 lb 9.6 oz (106.4 kg)  11/23/17 225 lb 6.4 oz (102.2 kg)     Review of Systems  Other than above, a 14 point review of systems is negative     Past Medical History:  Diagnosis Date  . Allergic rhinitis   . Anxiety   . DVT (deep venous thrombosis) (HCC)    RLE 2002 aprox, (-) anticoag panel  . GERD (gastroesophageal reflux disease)   . OSA on CPAP   . Palpitations    on betablokers    Past Surgical History:  Procedure Laterality Date  . NASAL SEPTOPLASTY W/ TURBINOPLASTY    . TONSILLECTOMY     Family History  Problem Relation Age of Onset  . Colon cancer Father 37          . Prostate cancer Father 39  . Diabetes Neg Hx   . Heart attack Neg Hx   . Stroke Neg Hx      Allergies as of 06/14/2019   No Known Allergies     Medication List       Accurate as of June 14, 2019  4:08 PM. If you have any questions, ask your nurse or doctor.        STOP taking these medications   benzonatate 100 MG capsule Commonly known as: TESSALON Stopped by: Willow Ora, MD   dicyclomine 20 MG tablet Commonly known as: Bentyl Stopped by: Willow Ora, MD   fluticasone 50 MCG/ACT nasal spray Commonly known as: FLONASE Stopped by: Willow Ora, MD   predniSONE 20 MG tablet Commonly known as: DELTASONE Stopped by: Willow Ora, MD     TAKE these medications   GOODY HEADACHE PO Take 1 packet by mouth daily as needed (for pain).   loratadine 10 MG tablet Commonly known as: CLARITIN Take 10 mg by mouth daily.   metoprolol succinate 25 MG 24 hr  tablet Commonly known as: TOPROL-XL 1 tablet daily as needed for palpitations   MULTIVITAMIN PO Take 1 tablet by mouth daily.   NON FORMULARY Inject 1 Dose as directed once a week. Allergy Shot   Oregano 1500 MG Caps Take 1,500 mg by mouth daily.   pantoprazole 40 MG tablet Commonly known as: PROTONIX Take 1 tablet (40 mg total) by mouth daily before breakfast.          Objective:   Physical Exam Skin:        BP 113/63 (BP Location: Left Arm, Patient Position: Sitting, Cuff Size: Normal)   Pulse (!) 58   Temp (!) 96.6 F (35.9 C) (Temporal)   Resp 16   Ht 6\' 3"  (1.905 m)   Wt 229 lb 6 oz (104 kg)   SpO2 100%   BMI 28.67 kg/m  General: Well developed, NAD, BMI noted Neck: No  thyromegaly  HEENT:  Normocephalic . Face symmetric, atraumatic Lungs:  CTA B Normal respiratory effort, no intercostal retractions, no accessory muscle use. Heart: RRR,  no murmur.  Abdomen:  Not distended, soft, non-tender. No rebound or rigidity.  Diatesis recti, small, noted. Lower extremities: no pretibial edema bilaterally  MSK: Upper extremities essentially normal. R arm seems to be slightly more muscular but no normalities noted Neurologic:  alert & oriented X3.  Speech normal, gait appropriate for age and unassisted Strength symmetric and appropriate for age.  Psych: Cognition and judgment appear intact.  Cooperative with normal attention span and concentration.  Behavior appropriate. No anxious or depressed appearing.     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  Safety protocols were in place, including screening questions prior to the visit, additional usage of staff PPE, and extensive cleaning of exam room while observing appropriate contact time as indicated for disinfecting solutions.

## 2019-06-14 NOTE — Assessment & Plan Note (Signed)
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

## 2019-06-15 LAB — ABO/RH: Rh Factor: POSITIVE

## 2019-06-22 DIAGNOSIS — J301 Allergic rhinitis due to pollen: Secondary | ICD-10-CM | POA: Diagnosis not present

## 2019-06-22 DIAGNOSIS — J3089 Other allergic rhinitis: Secondary | ICD-10-CM | POA: Diagnosis not present

## 2019-07-06 DIAGNOSIS — J3089 Other allergic rhinitis: Secondary | ICD-10-CM | POA: Diagnosis not present

## 2019-07-06 DIAGNOSIS — J301 Allergic rhinitis due to pollen: Secondary | ICD-10-CM | POA: Diagnosis not present

## 2019-07-20 DIAGNOSIS — J3089 Other allergic rhinitis: Secondary | ICD-10-CM | POA: Diagnosis not present

## 2019-07-20 DIAGNOSIS — J301 Allergic rhinitis due to pollen: Secondary | ICD-10-CM | POA: Diagnosis not present

## 2019-08-03 DIAGNOSIS — J301 Allergic rhinitis due to pollen: Secondary | ICD-10-CM | POA: Diagnosis not present

## 2019-08-03 DIAGNOSIS — J3089 Other allergic rhinitis: Secondary | ICD-10-CM | POA: Diagnosis not present

## 2019-08-10 DIAGNOSIS — J3089 Other allergic rhinitis: Secondary | ICD-10-CM | POA: Diagnosis not present

## 2019-08-10 DIAGNOSIS — J301 Allergic rhinitis due to pollen: Secondary | ICD-10-CM | POA: Diagnosis not present

## 2019-08-17 DIAGNOSIS — J3089 Other allergic rhinitis: Secondary | ICD-10-CM | POA: Diagnosis not present

## 2019-08-17 DIAGNOSIS — J301 Allergic rhinitis due to pollen: Secondary | ICD-10-CM | POA: Diagnosis not present

## 2019-08-30 DIAGNOSIS — J3089 Other allergic rhinitis: Secondary | ICD-10-CM | POA: Diagnosis not present

## 2019-08-30 DIAGNOSIS — J301 Allergic rhinitis due to pollen: Secondary | ICD-10-CM | POA: Diagnosis not present

## 2019-09-06 DIAGNOSIS — J301 Allergic rhinitis due to pollen: Secondary | ICD-10-CM | POA: Diagnosis not present

## 2019-09-06 DIAGNOSIS — J3089 Other allergic rhinitis: Secondary | ICD-10-CM | POA: Diagnosis not present

## 2019-09-06 DIAGNOSIS — H1045 Other chronic allergic conjunctivitis: Secondary | ICD-10-CM | POA: Diagnosis not present

## 2019-09-12 DIAGNOSIS — G4733 Obstructive sleep apnea (adult) (pediatric): Secondary | ICD-10-CM | POA: Diagnosis not present

## 2019-09-14 DIAGNOSIS — J301 Allergic rhinitis due to pollen: Secondary | ICD-10-CM | POA: Diagnosis not present

## 2019-09-14 DIAGNOSIS — J3089 Other allergic rhinitis: Secondary | ICD-10-CM | POA: Diagnosis not present

## 2019-09-21 DIAGNOSIS — J301 Allergic rhinitis due to pollen: Secondary | ICD-10-CM | POA: Diagnosis not present

## 2019-09-21 DIAGNOSIS — J3089 Other allergic rhinitis: Secondary | ICD-10-CM | POA: Diagnosis not present

## 2019-09-28 DIAGNOSIS — J3089 Other allergic rhinitis: Secondary | ICD-10-CM | POA: Diagnosis not present

## 2019-09-28 DIAGNOSIS — J301 Allergic rhinitis due to pollen: Secondary | ICD-10-CM | POA: Diagnosis not present

## 2019-10-05 DIAGNOSIS — J3089 Other allergic rhinitis: Secondary | ICD-10-CM | POA: Diagnosis not present

## 2019-10-05 DIAGNOSIS — J301 Allergic rhinitis due to pollen: Secondary | ICD-10-CM | POA: Diagnosis not present

## 2019-10-13 ENCOUNTER — Other Ambulatory Visit: Payer: Self-pay | Admitting: Internal Medicine

## 2019-10-19 DIAGNOSIS — J3081 Allergic rhinitis due to animal (cat) (dog) hair and dander: Secondary | ICD-10-CM | POA: Diagnosis not present

## 2019-10-19 DIAGNOSIS — J3089 Other allergic rhinitis: Secondary | ICD-10-CM | POA: Diagnosis not present

## 2019-10-19 DIAGNOSIS — J301 Allergic rhinitis due to pollen: Secondary | ICD-10-CM | POA: Diagnosis not present

## 2019-10-26 DIAGNOSIS — J3081 Allergic rhinitis due to animal (cat) (dog) hair and dander: Secondary | ICD-10-CM | POA: Diagnosis not present

## 2019-10-26 DIAGNOSIS — J3089 Other allergic rhinitis: Secondary | ICD-10-CM | POA: Diagnosis not present

## 2019-10-26 DIAGNOSIS — J301 Allergic rhinitis due to pollen: Secondary | ICD-10-CM | POA: Diagnosis not present

## 2019-10-28 DIAGNOSIS — R350 Frequency of micturition: Secondary | ICD-10-CM | POA: Diagnosis not present

## 2019-10-28 DIAGNOSIS — N401 Enlarged prostate with lower urinary tract symptoms: Secondary | ICD-10-CM | POA: Diagnosis not present

## 2019-11-07 DIAGNOSIS — M25511 Pain in right shoulder: Secondary | ICD-10-CM | POA: Diagnosis not present

## 2019-11-08 DIAGNOSIS — N401 Enlarged prostate with lower urinary tract symptoms: Secondary | ICD-10-CM | POA: Diagnosis not present

## 2019-11-08 DIAGNOSIS — R3912 Poor urinary stream: Secondary | ICD-10-CM | POA: Diagnosis not present

## 2019-11-08 DIAGNOSIS — R350 Frequency of micturition: Secondary | ICD-10-CM | POA: Diagnosis not present

## 2019-11-09 DIAGNOSIS — J301 Allergic rhinitis due to pollen: Secondary | ICD-10-CM | POA: Diagnosis not present

## 2019-11-09 DIAGNOSIS — J3089 Other allergic rhinitis: Secondary | ICD-10-CM | POA: Diagnosis not present

## 2019-11-16 DIAGNOSIS — J3089 Other allergic rhinitis: Secondary | ICD-10-CM | POA: Diagnosis not present

## 2019-11-16 DIAGNOSIS — J301 Allergic rhinitis due to pollen: Secondary | ICD-10-CM | POA: Diagnosis not present

## 2019-11-23 DIAGNOSIS — J3089 Other allergic rhinitis: Secondary | ICD-10-CM | POA: Diagnosis not present

## 2019-11-23 DIAGNOSIS — J301 Allergic rhinitis due to pollen: Secondary | ICD-10-CM | POA: Diagnosis not present

## 2019-12-07 DIAGNOSIS — J301 Allergic rhinitis due to pollen: Secondary | ICD-10-CM | POA: Diagnosis not present

## 2019-12-07 DIAGNOSIS — J3089 Other allergic rhinitis: Secondary | ICD-10-CM | POA: Diagnosis not present

## 2019-12-14 DIAGNOSIS — J301 Allergic rhinitis due to pollen: Secondary | ICD-10-CM | POA: Diagnosis not present

## 2019-12-14 DIAGNOSIS — J3089 Other allergic rhinitis: Secondary | ICD-10-CM | POA: Diagnosis not present

## 2019-12-21 DIAGNOSIS — J301 Allergic rhinitis due to pollen: Secondary | ICD-10-CM | POA: Diagnosis not present

## 2019-12-21 DIAGNOSIS — J3089 Other allergic rhinitis: Secondary | ICD-10-CM | POA: Diagnosis not present

## 2019-12-22 DIAGNOSIS — Z20822 Contact with and (suspected) exposure to covid-19: Secondary | ICD-10-CM | POA: Diagnosis not present

## 2019-12-28 DIAGNOSIS — J3081 Allergic rhinitis due to animal (cat) (dog) hair and dander: Secondary | ICD-10-CM | POA: Diagnosis not present

## 2019-12-28 DIAGNOSIS — J3089 Other allergic rhinitis: Secondary | ICD-10-CM | POA: Diagnosis not present

## 2019-12-28 DIAGNOSIS — J301 Allergic rhinitis due to pollen: Secondary | ICD-10-CM | POA: Diagnosis not present

## 2019-12-29 DIAGNOSIS — J3089 Other allergic rhinitis: Secondary | ICD-10-CM | POA: Diagnosis not present

## 2020-01-04 DIAGNOSIS — J3089 Other allergic rhinitis: Secondary | ICD-10-CM | POA: Diagnosis not present

## 2020-01-04 DIAGNOSIS — J301 Allergic rhinitis due to pollen: Secondary | ICD-10-CM | POA: Diagnosis not present

## 2020-01-25 DIAGNOSIS — J301 Allergic rhinitis due to pollen: Secondary | ICD-10-CM | POA: Diagnosis not present

## 2020-01-25 DIAGNOSIS — J3081 Allergic rhinitis due to animal (cat) (dog) hair and dander: Secondary | ICD-10-CM | POA: Diagnosis not present

## 2020-01-25 DIAGNOSIS — J3089 Other allergic rhinitis: Secondary | ICD-10-CM | POA: Diagnosis not present

## 2020-02-01 DIAGNOSIS — J3089 Other allergic rhinitis: Secondary | ICD-10-CM | POA: Diagnosis not present

## 2020-02-01 DIAGNOSIS — J301 Allergic rhinitis due to pollen: Secondary | ICD-10-CM | POA: Diagnosis not present

## 2020-02-26 DIAGNOSIS — Z1152 Encounter for screening for COVID-19: Secondary | ICD-10-CM | POA: Diagnosis not present

## 2020-02-29 DIAGNOSIS — J301 Allergic rhinitis due to pollen: Secondary | ICD-10-CM | POA: Diagnosis not present

## 2020-02-29 DIAGNOSIS — J3089 Other allergic rhinitis: Secondary | ICD-10-CM | POA: Diagnosis not present

## 2020-03-07 DIAGNOSIS — J301 Allergic rhinitis due to pollen: Secondary | ICD-10-CM | POA: Diagnosis not present

## 2020-03-07 DIAGNOSIS — J3081 Allergic rhinitis due to animal (cat) (dog) hair and dander: Secondary | ICD-10-CM | POA: Diagnosis not present

## 2020-03-07 DIAGNOSIS — J3089 Other allergic rhinitis: Secondary | ICD-10-CM | POA: Diagnosis not present

## 2020-03-21 DIAGNOSIS — J3089 Other allergic rhinitis: Secondary | ICD-10-CM | POA: Diagnosis not present

## 2020-03-21 DIAGNOSIS — J301 Allergic rhinitis due to pollen: Secondary | ICD-10-CM | POA: Diagnosis not present

## 2020-03-23 ENCOUNTER — Other Ambulatory Visit: Payer: Self-pay

## 2020-03-23 ENCOUNTER — Ambulatory Visit: Payer: BC Managed Care – PPO | Admitting: Internal Medicine

## 2020-03-23 ENCOUNTER — Encounter: Payer: Self-pay | Admitting: Internal Medicine

## 2020-03-23 VITALS — BP 122/79 | HR 55 | Temp 98.2°F | Resp 16 | Ht 75.0 in | Wt 231.2 lb

## 2020-03-23 DIAGNOSIS — K5900 Constipation, unspecified: Secondary | ICD-10-CM | POA: Diagnosis not present

## 2020-03-23 LAB — CBC WITH DIFFERENTIAL/PLATELET
Basophils Absolute: 0 10*3/uL (ref 0.0–0.1)
Basophils Relative: 0.6 % (ref 0.0–3.0)
Eosinophils Absolute: 0 10*3/uL (ref 0.0–0.7)
Eosinophils Relative: 1 % (ref 0.0–5.0)
HCT: 40.5 % (ref 39.0–52.0)
Hemoglobin: 13.2 g/dL (ref 13.0–17.0)
Lymphocytes Relative: 34.8 % (ref 12.0–46.0)
Lymphs Abs: 1.2 10*3/uL (ref 0.7–4.0)
MCHC: 32.6 g/dL (ref 30.0–36.0)
MCV: 93.3 fl (ref 78.0–100.0)
Monocytes Absolute: 0.4 10*3/uL (ref 0.1–1.0)
Monocytes Relative: 11.7 % (ref 3.0–12.0)
Neutro Abs: 1.8 10*3/uL (ref 1.4–7.7)
Neutrophils Relative %: 51.9 % (ref 43.0–77.0)
Platelets: 156 10*3/uL (ref 150.0–400.0)
RBC: 4.34 Mil/uL (ref 4.22–5.81)
RDW: 12.6 % (ref 11.5–15.5)
WBC: 3.5 10*3/uL — ABNORMAL LOW (ref 4.0–10.5)

## 2020-03-23 NOTE — Patient Instructions (Signed)
Continue with your healthy diet  Take a stool softener daily  Okay to take MiraLAX 17 g with fluids as needed if you feel constipated.  Call anytime if you develop fever, weight loss, you have alternating diarrhea.  Go to the lab

## 2020-03-23 NOTE — Progress Notes (Signed)
Pre visit review using our clinic review tool, if applicable. No additional management support is needed unless otherwise documented below in the visit note. 

## 2020-03-23 NOTE — Progress Notes (Signed)
Subjective:    Patient ID: Ronnie [name], male    DOB: [birth year], 60 y.o.   MRN: [id number]  DOS:  03/23/2020 Type of visit - description: Acute Has episode when  his stools are hard and he has to strain. Rarely has seen blood on top of the stool, one time he saw blood in the toilet paper. Again very rarely he has left-sided abdominal discomfort, ill-defined. He has a healthy diet, takes Benefiber once daily.   Wt Readings from Last 3 Encounters:  03/23/20 231 lb 4 oz (104.9 kg)  06/14/19 229 lb 6 oz (104 kg)  11/15/18 234 lb 9.6 oz (106.4 kg)     Review of Systems Denies fever chills No nausea, vomiting.  No alternating diarrhea. Denies any anal pain but has occasional itching.  Past Medical History:  Diagnosis Date  . Allergic rhinitis   . Anxiety   . DVT (deep venous thrombosis) (HCC)    RLE 2002 aprox, (-) anticoag panel  . GERD (gastroesophageal reflux disease)   . OSA on CPAP   . Palpitations    on betablokers    Past Surgical History:  Procedure Laterality Date  . NASAL SEPTOPLASTY W/ TURBINOPLASTY    . TONSILLECTOMY      Allergies as of 03/23/2020   No Known Allergies     Medication List       Accurate as of March 23, 2020 10:05 AM. If you have any questions, ask your nurse or doctor.        GOODY HEADACHE PO Take 1 packet by mouth daily as needed (for pain).   loratadine 10 MG tablet Commonly known as: CLARITIN Take 10 mg by mouth daily.   metoprolol succinate 25 MG 24 hr tablet Commonly known as: TOPROL-XL TAKE 1 TABLET BY MOUTH EVERY DAY AS NEEDED FOR PALPITATIONS   MULTIVITAMIN PO Take 1 tablet by mouth daily.   NON FORMULARY Inject 1 Dose as directed once a week. Allergy Shot   Oregano 1500 MG Caps Take 1,500 mg by mouth daily.   pantoprazole 40 MG tablet Commonly known as: PROTONIX TAKE 1 TABLET(40 MG) BY MOUTH DAILY BEFORE BREAKFAST          Objective:   Physical Exam BP 122/79 (BP Location: Left Arm, Patient  Position: Sitting, Cuff Size: Normal)   Pulse (!) 55   Temp 98.2 F (36.8 C) (Oral)   Resp 16   Ht 6\' 3"  (1.905 m)   Wt 231 lb 4 oz (104.9 kg)   SpO2 100%   BMI 28.90 kg/m  General:   Well developed, NAD, BMI noted.  HEENT:  Normocephalic . Face symmetric, atraumatic Abdomen:  Not distended, soft, non-tender. No rebound or rigidity.   DRE: Normal sphincter tone, normal prostate, no mass. Procedure note: With the patient verbal concerns I introduced self- lighted  anoscope, other than several small internal hemorrhoids with no active bleeding, no other abnormalities.  Patient tolerated well.  No blood loss. Skin: Not pale. Not jaundice Lower extremities: no pretibial edema bilaterally  Neurologic:  alert & oriented X3.  Speech normal, gait appropriate for age and unassisted Psych--  Cognition and judgment appear intact.  Cooperative with normal attention span and concentration.  Behavior appropriate. No anxious or depressed appearing.     Assessment     Assessment Anxiety  Palpitations on bb qd OSA, on CPAP GERD Allergic rhinitis, weekly shots, Dr DVT, RLE 2002 , (-)  coagulation panel  PLAN: Constipation: The patient  has mostly normal bowel movements but from time to time stools are hard and he has to strain.  Has seen small amount of blood sometimes on the stools. No fever chills or weight loss. Father had colon cancer at age 4. He had a normal colonoscopy 04/09/2017.  That is very reassuring. Anoscopy today showed internal hemorrhoids. He already has a healthy diet with fruit, vegetables and Benefiber. Plan: CBC, stool softener, MiraLAX  prn If anemia or if sxs persist or if y fever, weight loss, increased pain, alternating diarrhea: Will need further evaluation. Patient understand and agreed with above.   This visit occurred during the SARS-CoV-2 public health emergency.  Safety protocols were in place, including screening questions prior to the visit,  additional usage of staff PPE, and extensive cleaning of exam room while observing appropriate contact time as indicated for disinfecting solutions.

## 2020-03-25 NOTE — Assessment & Plan Note (Signed)
Constipation: The patient has mostly normal bowel movements but from time to time stools are hard and he has to strain.  Has seen small amount of blood sometimes on the stools. No fever chills or weight loss. Father had colon cancer at age 58. He had a normal colonoscopy 04/09/2017.  That is very reassuring. Anoscopy today showed internal hemorrhoids. He already has a healthy diet with fruit, vegetables and Benefiber. Plan: CBC, stool softener, MiraLAX  prn If anemia or if sxs persist or if y fever, weight loss, increased pain, alternating diarrhea: Will need further evaluation. Patient understand and agreed with above.

## 2020-04-04 DIAGNOSIS — J3089 Other allergic rhinitis: Secondary | ICD-10-CM | POA: Diagnosis not present

## 2020-04-04 DIAGNOSIS — J3081 Allergic rhinitis due to animal (cat) (dog) hair and dander: Secondary | ICD-10-CM | POA: Diagnosis not present

## 2020-04-04 DIAGNOSIS — J301 Allergic rhinitis due to pollen: Secondary | ICD-10-CM | POA: Diagnosis not present

## 2020-04-11 DIAGNOSIS — J3089 Other allergic rhinitis: Secondary | ICD-10-CM | POA: Diagnosis not present

## 2020-04-11 DIAGNOSIS — J301 Allergic rhinitis due to pollen: Secondary | ICD-10-CM | POA: Diagnosis not present

## 2020-04-18 DIAGNOSIS — J301 Allergic rhinitis due to pollen: Secondary | ICD-10-CM | POA: Diagnosis not present

## 2020-04-18 DIAGNOSIS — J3089 Other allergic rhinitis: Secondary | ICD-10-CM | POA: Diagnosis not present

## 2020-05-02 DIAGNOSIS — G4733 Obstructive sleep apnea (adult) (pediatric): Secondary | ICD-10-CM | POA: Diagnosis not present

## 2020-05-02 DIAGNOSIS — J3089 Other allergic rhinitis: Secondary | ICD-10-CM | POA: Diagnosis not present

## 2020-05-02 DIAGNOSIS — J301 Allergic rhinitis due to pollen: Secondary | ICD-10-CM | POA: Diagnosis not present

## 2020-05-09 DIAGNOSIS — J3089 Other allergic rhinitis: Secondary | ICD-10-CM | POA: Diagnosis not present

## 2020-05-09 DIAGNOSIS — J301 Allergic rhinitis due to pollen: Secondary | ICD-10-CM | POA: Diagnosis not present

## 2020-05-16 DIAGNOSIS — J301 Allergic rhinitis due to pollen: Secondary | ICD-10-CM | POA: Diagnosis not present

## 2020-05-16 DIAGNOSIS — J3089 Other allergic rhinitis: Secondary | ICD-10-CM | POA: Diagnosis not present

## 2020-05-23 DIAGNOSIS — J3081 Allergic rhinitis due to animal (cat) (dog) hair and dander: Secondary | ICD-10-CM | POA: Diagnosis not present

## 2020-05-23 DIAGNOSIS — J301 Allergic rhinitis due to pollen: Secondary | ICD-10-CM | POA: Diagnosis not present

## 2020-05-23 DIAGNOSIS — J3089 Other allergic rhinitis: Secondary | ICD-10-CM | POA: Diagnosis not present

## 2020-06-06 DIAGNOSIS — J301 Allergic rhinitis due to pollen: Secondary | ICD-10-CM | POA: Diagnosis not present

## 2020-06-06 DIAGNOSIS — J3089 Other allergic rhinitis: Secondary | ICD-10-CM | POA: Diagnosis not present

## 2020-06-13 DIAGNOSIS — J3089 Other allergic rhinitis: Secondary | ICD-10-CM | POA: Diagnosis not present

## 2020-06-13 DIAGNOSIS — J301 Allergic rhinitis due to pollen: Secondary | ICD-10-CM | POA: Diagnosis not present

## 2020-06-14 ENCOUNTER — Encounter: Payer: Self-pay | Admitting: Internal Medicine

## 2020-06-14 ENCOUNTER — Ambulatory Visit (INDEPENDENT_AMBULATORY_CARE_PROVIDER_SITE_OTHER): Payer: BC Managed Care – PPO | Admitting: Internal Medicine

## 2020-06-14 ENCOUNTER — Other Ambulatory Visit: Payer: Self-pay

## 2020-06-14 VITALS — BP 126/78 | HR 59 | Temp 97.7°F | Resp 16 | Ht 75.0 in | Wt 236.4 lb

## 2020-06-14 DIAGNOSIS — Z0000 Encounter for general adult medical examination without abnormal findings: Secondary | ICD-10-CM | POA: Diagnosis not present

## 2020-06-14 LAB — COMPREHENSIVE METABOLIC PANEL
ALT: 18 U/L (ref 0–53)
AST: 16 U/L (ref 0–37)
Albumin: 4.2 g/dL (ref 3.5–5.2)
Alkaline Phosphatase: 65 U/L (ref 39–117)
BUN: 11 mg/dL (ref 6–23)
CO2: 28 mEq/L (ref 19–32)
Calcium: 9.3 mg/dL (ref 8.4–10.5)
Chloride: 106 mEq/L (ref 96–112)
Creatinine, Ser: 1.07 mg/dL (ref 0.40–1.50)
GFR: 77.07 mL/min (ref >60.00)
Glucose, Bld: 87 mg/dL (ref 70–99)
Potassium: 4.3 mEq/L (ref 3.5–5.1)
Sodium: 141 mEq/L (ref 135–145)
Total Bilirubin: 0.8 mg/dL (ref 0.2–1.2)
Total Protein: 7.1 g/dL (ref 6.0–8.3)

## 2020-06-14 LAB — LIPID PANEL
Cholesterol: 131 mg/dL (ref 0–200)
HDL: 36 mg/dL — ABNORMAL LOW (ref >39.00)
LDL Cholesterol: 62 mg/dL (ref 0–99)
NonHDL: 94.85
Total CHOL/HDL Ratio: 4
Triglycerides: 164 mg/dL — ABNORMAL HIGH (ref 0.0–149.0)
VLDL: 32.8 mg/dL (ref 0.0–40.0)

## 2020-06-14 NOTE — Progress Notes (Signed)
Subjective:    Patient ID: Ronnie [name], male    DOB: 12-Aug-1960, 60 y.o.   MRN: [id number]  DOS:  06/14/2020 Type of visit - description: CPX Here for CPX Since the last office visit is doing well. Has really no concerns other than he is unable to lose weight despite a very healthy lifestyle.   Review of Systems  Other than above, a 14 point review of systems is negative     Past Medical History:  Diagnosis Date  . Allergic rhinitis   . Anxiety   . DVT (deep venous thrombosis) (HCC)    RLE 2002 aprox, (-) anticoag panel  . GERD (gastroesophageal reflux disease)   . OSA on CPAP   . Palpitations    on betablokers    Past Surgical History:  Procedure Laterality Date  . NASAL SEPTOPLASTY W/ TURBINOPLASTY    . TONSILLECTOMY      Social History   Socioeconomic History  . Marital status: Married    Spouse name: Not on file  . Number of children: 1  . Years of education: Not on file  . Highest education level: Not on file  Occupational History  . Occupation: consulting work  Tobacco Use  . Smoking status: Never Smoker  . Smokeless tobacco: Never Used  Vaping Use  . Vaping Use: Never used  Substance and Sexual Activity  . Alcohol use: Yes    Comment: rare  . Drug use: No  . Sexual activity: Not on file  Other Topics Concern  . Not on file  Social History Narrative   Lives w/ wife   Child born 2000, gone to college    Lost father 01-2014            Social Determinants of Health   Financial Resource Strain: Not on file  Food Insecurity: Not on file  Transportation Needs: Not on file  Physical Activity: Not on file  Stress: Not on file  Social Connections: Not on file  Intimate Partner Violence: Not on file    Allergies as of 06/14/2020   No Known Allergies     Medication List       Accurate as of June 14, 2020 11:59 PM. If you have any questions, ask your nurse or doctor.        GOODY HEADACHE PO Take 1 packet by mouth daily as needed  (for pain).   loratadine 10 MG tablet Commonly known as: CLARITIN Take 10 mg by mouth daily.   metoprolol succinate 25 MG 24 hr tablet Commonly known as: TOPROL-XL TAKE 1 TABLET BY MOUTH EVERY DAY AS NEEDED FOR PALPITATIONS   MULTIVITAMIN PO Take 1 tablet by mouth daily.   NON FORMULARY Inject 1 Dose as directed once a week. Allergy Shot   Oregano 1500 MG Caps Take 1,500 mg by mouth daily.   pantoprazole 40 MG tablet Commonly known as: PROTONIX TAKE 1 TABLET(40 MG) BY MOUTH DAILY BEFORE BREAKFAST          Objective:   Physical Exam BP 126/78 (BP Location: Left Arm, Patient Position: Sitting, Cuff Size: Normal)   Pulse (!) 59   Temp 97.7 F (36.5 C) (Oral)   Resp 16   Ht 6\' 3"  (1.905 m)   Wt 236 lb 6 oz (107.2 kg)   SpO2 96%   BMI 29.54 kg/m  General: Well developed, NAD, BMI noted Neck: No  thyromegaly  HEENT:  Normocephalic . Face symmetric, atraumatic Lungs:  CTA B Normal  respiratory effort, no intercostal retractions, no accessory muscle use. Heart: RRR,  no murmur.  Abdomen:  Not distended, soft, non-tender. No rebound or rigidity.   Lower extremities: no pretibial edema bilaterally  Skin: Exposed areas without rash. Not pale. Not jaundice Neurologic:  alert & oriented X3.  Speech normal, gait appropriate for age and unassisted Strength symmetric and appropriate for age.  Psych: Cognition and judgment appear intact.  Cooperative with normal attention span and concentration.  Behavior appropriate. No anxious or depressed appearing.     Assessment    Assessment Anxiety  Palpitations on bb qd OSA, on CPAP GERD Allergic rhinitis, weekly shots, Dr [place] DVT, RLE 2002 , (-)  coagulation panel  PLAN: Here for CPX Palpitations: Controlled on beta-blockers. OSA: Good CPAP compliance GERD: Well-controlled with PPIs except for dietary indiscretions.  Rec  good habits with the potential of needing less PPIs. RTC 1 year   This visit occurred  during the SARS-CoV-2 public health emergency.  Safety protocols were in place, including screening questions prior to the visit, additional usage of staff PPE, and extensive cleaning of exam room while observing appropriate contact time as indicated for disinfecting solutions.

## 2020-06-14 NOTE — Patient Instructions (Addendum)
GO TO THE LAB : Get the blood work   ? ? ?GO TO THE FRONT DESK, PLEASE SCHEDULE YOUR APPOINTMENTS ?Come back for a physical in 1 year ? ? ? ? ? ?"Living will", "Health Care Power of attorney": Advanced care planning ? ?(If you already have a living will or healthcare power of attorney, please bring the copy to be scanned in your chart.) ? ?Advance care planning is a process that supports adults in  understanding and sharing their preferences regarding future medical care.  ? ?The patient's preferences are recorded in documents called Advance Directives.    ?Advanced directives are completed (and can be modified at any time) while the patient is in full mental capacity.  ? ?The documentation should be available at all times to the patient, the family and the healthcare providers.  ?Bring in a copy to be scanned in your chart is an excellent idea and is recommended  ? ?This legal documents direct treatment decision making and/or appoint a surrogate to make the decision if the patient is not capable to do so.  ? ? ?Advance directives can be documented in many types of formats,  documents have names such as:  ?Lliving will  ?Durable power of attorney for healthcare (healthcare proxy or healthcare power of attorney)  ?Combined directives  ?Physician orders for life-sustaining treatment  ?  ?More information at: ? ?[email or web address]  ?

## 2020-06-15 ENCOUNTER — Encounter: Payer: Self-pay | Admitting: Internal Medicine

## 2020-06-15 NOTE — Assessment & Plan Note (Signed)
--  Td 2015 -Shingrix discussed, patient is definitely not interested -S/p Covid vax x 3, booster discussed, he plans to proceed at some point --CCS: +FH Cscope 10-08 per pt (2 polyps) cscope 11-2011 cscope 03/2017 , neg, next 5 years (Dr Candelaria Stagers) --PSAs   was 0.21 October 2019 (per [facility]), sees urology -- Diet-exercise: Excellent lifestyle, he is somewhat frustrated about not being able to lose weight.  Calorie counting? -Labs: CMP, FLP - Advance care directives discussed

## 2020-06-15 NOTE — Assessment & Plan Note (Signed)
Here for CPX Palpitations: Controlled on beta-blockers. OSA: Good CPAP compliance GERD: Well-controlled with PPIs except for dietary indiscretions.  Rec  good habits with the potential of needing less PPIs. RTC 1 year

## 2020-07-02 ENCOUNTER — Other Ambulatory Visit: Payer: Self-pay | Admitting: Internal Medicine

## 2020-07-04 DIAGNOSIS — J301 Allergic rhinitis due to pollen: Secondary | ICD-10-CM | POA: Diagnosis not present

## 2020-07-04 DIAGNOSIS — M546 Pain in thoracic spine: Secondary | ICD-10-CM | POA: Diagnosis not present

## 2020-07-04 DIAGNOSIS — J3089 Other allergic rhinitis: Secondary | ICD-10-CM | POA: Diagnosis not present

## 2020-07-04 DIAGNOSIS — M545 Low back pain, unspecified: Secondary | ICD-10-CM | POA: Diagnosis not present

## 2020-07-06 ENCOUNTER — Telehealth: Payer: Self-pay | Admitting: Pulmonary Disease

## 2020-07-06 NOTE — Telephone Encounter (Signed)
Pt is calling in regards to issues with CPAP machine and stated that he has reached out to [facility] and they informed him to reach out to Dr. Craige Cotta for further steps with getting a new order placed. Pt was last seen on 10/2018. Pls regard; [phone]. Did schedule appt with Dr. Craige Cotta for 07/11/2020 for overdue f/u.

## 2020-07-06 NOTE — Telephone Encounter (Signed)
Spoke with the pt  He states not feeling well rested during the day and thinks his CPAP not working well for him  Not seen since 2020 and already has appt pending with [facility] for 07/11/20  I advised will need to keep this appt and we will discuss at that time  He verbalized understanding and nothing further needed

## 2020-07-11 ENCOUNTER — Ambulatory Visit: Payer: BC Managed Care – PPO | Admitting: Pulmonary Disease

## 2020-07-11 ENCOUNTER — Other Ambulatory Visit: Payer: Self-pay

## 2020-07-11 ENCOUNTER — Encounter: Payer: Self-pay | Admitting: Pulmonary Disease

## 2020-07-11 ENCOUNTER — Ambulatory Visit (INDEPENDENT_AMBULATORY_CARE_PROVIDER_SITE_OTHER): Payer: BC Managed Care – PPO | Admitting: Family

## 2020-07-11 VITALS — BP 108/66 | HR 56 | Temp 97.2°F | Ht 75.0 in | Wt 234.8 lb

## 2020-07-11 VITALS — BP 112/69 | HR 53 | Temp 98.1°F | Resp 16 | Ht 75.0 in | Wt 234.0 lb

## 2020-07-11 DIAGNOSIS — J3089 Other allergic rhinitis: Secondary | ICD-10-CM | POA: Diagnosis not present

## 2020-07-11 DIAGNOSIS — Z9989 Dependence on other enabling machines and devices: Secondary | ICD-10-CM | POA: Diagnosis not present

## 2020-07-11 DIAGNOSIS — J301 Allergic rhinitis due to pollen: Secondary | ICD-10-CM | POA: Diagnosis not present

## 2020-07-11 DIAGNOSIS — S39012A Strain of muscle, fascia and tendon of lower back, initial encounter: Secondary | ICD-10-CM | POA: Insufficient documentation

## 2020-07-11 DIAGNOSIS — G4733 Obstructive sleep apnea (adult) (pediatric): Secondary | ICD-10-CM | POA: Diagnosis not present

## 2020-07-11 NOTE — Patient Instructions (Signed)
Will have [employer] arrange for new auto CPAP machine  Follow up in 4 to 5 months
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
## 2020-07-11 NOTE — Progress Notes (Signed)
Pulmonary, Critical Care, and Sleep Medicine  Chief Complaint  Patient presents with  . Follow-up    Sleep apnea machine not working now more tired.     Constitutional:  BP 108/66 (BP Location: Left Arm, Patient Position: Sitting, Cuff Size: Normal)   Pulse (!) 56   Temp (!) 97.2 F (36.2 C) (Temporal)   Ht 6\' 3"  (1.905 m)   Wt 234 lb 12.8 oz (106.5 kg)   SpO2 98%   BMI 29.35 kg/m   Past Medical History:  GERD, DVT 2002, Anxiety, Palpitations  Past Surgical History:  He  has a past surgical history that includes Tonsillectomy and Nasal septoplasty w/ turbinoplasty.  Brief Summary:  Ronnie [name] is a 60 y.o. male with obstructive sleep apnea.  He had prior UPPP.      Subjective:   He uses CPAP nightly.  His machine is from 2015.  3G network no longer operational and his device no longer sending signal.  He doesn't feel like pressure deliver is working like before.  He has noticed feeling more tired during the day.  No issues with mask fit.  Physical Exam:   Appearance - well kempt   ENMT - no sinus tenderness, no oral exudate, no LAN, Mallampati 3 airway, no stridor, changes of UPPP  Respiratory - equal breath sounds bilaterally, no wheezing or rales  CV - s1s2 regular rate and rhythm, no murmurs  Ext - no clubbing, no edema  Skin - no rashes  Psych - normal mood and affect   Sleep Tests:   PSG 04/10/03 >> AHI 43  Auto CPAP 04/28/20 to 05/27/20 >> used on 30 of 30 nights with average 4 hrs 46 min.  Average AHI 2.7 with median CPAP 6 and 95 th percentile CPAP 9 cm H2O  Social History:  He  reports that he has never smoked. He has never used smokeless tobacco. He reports current alcohol use. He reports that he does not use drugs.  Family History:  His family history includes Colon cancer (age of onset: 77) in his father; Prostate cancer (age of onset: 58) in his father.     Assessment/Plan:   Obstructive sleep apnea. - he is compliant with  CPAP and reports benefit - he uses [employer] for his DME - his machine is more than 58 yrs old, not functioning properly and not amenable to repair - will arrange for new auto CPAP 5 to 10 cm H2O  Time Spent Involved in Patient Care on Day of Examination:  21 minutes  Follow up:  Patient Instructions  Will have [employer] arrange for new auto CPAP machine  Follow up in 4 to 5 months   Medication List:   Allergies as of 07/11/2020   No Known Allergies     Medication List       Accurate as of Jul 11, 2020 11:51 AM. If you have any questions, ask your nurse or doctor.        cyclobenzaprine 10 MG tablet Commonly known as: FLEXERIL Take 1 tablet by mouth 3 (three) times daily.   diclofenac 50 MG tablet Commonly known as: CATAFLAM Take 50 mg by mouth 3 (three) times daily as needed.   GOODY HEADACHE PO Take 1 packet by mouth daily as needed (for pain).   loratadine 10 MG tablet Commonly known as: CLARITIN Take 10 mg by mouth daily.   metoprolol succinate 25 MG 24 hr tablet Commonly known as: TOPROL-XL TAKE 1 TABLET BY  MOUTH EVERY DAY AS NEEDED FOR PALPITATIONS   MULTIVITAMIN PO Take 1 tablet by mouth daily.   NON FORMULARY Inject 1 Dose as directed once a week. Allergy Shot   Oregano 1500 MG Caps Take 1,500 mg by mouth daily.   pantoprazole 40 MG tablet Commonly known as: PROTONIX TAKE 1 TABLET(40 MG) BY MOUTH DAILY BEFORE BREAKFAST       Signature:  Coralyn Helling, MD [facility] Pager - [phone] 07/11/2020, 11:51 AM

## 2020-07-11 NOTE — Patient Instructions (Addendum)
You can use tylenol and heating pad as needed for back pain. Continue diclofenac and flexeril (muscle relaxer) as needed.  Please call if pain worsens or if not improved in 2-3 weeks.

## 2020-07-23 ENCOUNTER — Encounter: Payer: Self-pay | Admitting: Family

## 2020-07-23 DIAGNOSIS — M545 Low back pain, unspecified: Secondary | ICD-10-CM

## 2020-07-25 DIAGNOSIS — J301 Allergic rhinitis due to pollen: Secondary | ICD-10-CM | POA: Diagnosis not present

## 2020-07-25 DIAGNOSIS — J3089 Other allergic rhinitis: Secondary | ICD-10-CM | POA: Diagnosis not present

## 2020-08-01 DIAGNOSIS — J3089 Other allergic rhinitis: Secondary | ICD-10-CM | POA: Diagnosis not present

## 2020-08-01 DIAGNOSIS — J301 Allergic rhinitis due to pollen: Secondary | ICD-10-CM | POA: Diagnosis not present

## 2020-08-08 DIAGNOSIS — J3089 Other allergic rhinitis: Secondary | ICD-10-CM | POA: Diagnosis not present

## 2020-08-08 DIAGNOSIS — J301 Allergic rhinitis due to pollen: Secondary | ICD-10-CM | POA: Diagnosis not present

## 2020-08-15 DIAGNOSIS — J301 Allergic rhinitis due to pollen: Secondary | ICD-10-CM | POA: Diagnosis not present

## 2020-08-15 DIAGNOSIS — J3089 Other allergic rhinitis: Secondary | ICD-10-CM | POA: Diagnosis not present

## 2020-08-23 DIAGNOSIS — J301 Allergic rhinitis due to pollen: Secondary | ICD-10-CM | POA: Diagnosis not present

## 2020-08-24 DIAGNOSIS — J3089 Other allergic rhinitis: Secondary | ICD-10-CM | POA: Diagnosis not present

## 2020-08-29 DIAGNOSIS — J3089 Other allergic rhinitis: Secondary | ICD-10-CM | POA: Diagnosis not present

## 2020-08-29 DIAGNOSIS — J301 Allergic rhinitis due to pollen: Secondary | ICD-10-CM | POA: Diagnosis not present

## 2020-09-05 DIAGNOSIS — J3089 Other allergic rhinitis: Secondary | ICD-10-CM | POA: Diagnosis not present

## 2020-09-05 DIAGNOSIS — J3081 Allergic rhinitis due to animal (cat) (dog) hair and dander: Secondary | ICD-10-CM | POA: Diagnosis not present

## 2020-09-05 DIAGNOSIS — H1045 Other chronic allergic conjunctivitis: Secondary | ICD-10-CM | POA: Diagnosis not present

## 2020-09-05 DIAGNOSIS — J301 Allergic rhinitis due to pollen: Secondary | ICD-10-CM | POA: Diagnosis not present

## 2020-09-12 DIAGNOSIS — J3089 Other allergic rhinitis: Secondary | ICD-10-CM | POA: Diagnosis not present

## 2020-09-12 DIAGNOSIS — J301 Allergic rhinitis due to pollen: Secondary | ICD-10-CM | POA: Diagnosis not present

## 2020-09-19 DIAGNOSIS — J3089 Other allergic rhinitis: Secondary | ICD-10-CM | POA: Diagnosis not present

## 2020-09-19 DIAGNOSIS — J301 Allergic rhinitis due to pollen: Secondary | ICD-10-CM | POA: Diagnosis not present

## 2020-09-20 ENCOUNTER — Other Ambulatory Visit: Payer: Self-pay | Admitting: Internal Medicine

## 2020-09-28 ENCOUNTER — Emergency Department (HOSPITAL_COMMUNITY)
Admission: EM | Admit: 2020-09-28 | Discharge: 2020-09-28 | Disposition: A | Discharge status: Home / Self Care | Payer: BC Managed Care – PPO | Attending: Emergency Medicine | Admitting: Emergency Medicine

## 2020-09-28 ENCOUNTER — Emergency Department (HOSPITAL_BASED_OUTPATIENT_CLINIC_OR_DEPARTMENT_OTHER)
Admit: 2020-09-28 | Discharge: 2020-09-28 | Disposition: A | Discharge status: Home / Self Care | Payer: BC Managed Care – PPO | Attending: Emergency Medicine | Admitting: Emergency Medicine

## 2020-09-28 ENCOUNTER — Inpatient Hospital Stay (HOSPITAL_COMMUNITY): Admit: 2020-09-28 | Payer: BC Managed Care – PPO

## 2020-09-28 ENCOUNTER — Encounter (HOSPITAL_COMMUNITY): Payer: Self-pay | Admitting: *Deleted

## 2020-09-28 DIAGNOSIS — I82402 Acute embolism and thrombosis of unspecified deep veins of left lower extremity: Secondary | ICD-10-CM | POA: Diagnosis not present

## 2020-09-28 DIAGNOSIS — I82462 Acute embolism and thrombosis of left calf muscular vein: Secondary | ICD-10-CM | POA: Diagnosis not present

## 2020-09-28 DIAGNOSIS — M7989 Other specified soft tissue disorders: Secondary | ICD-10-CM | POA: Diagnosis not present

## 2020-09-28 DIAGNOSIS — M79662 Pain in left lower leg: Secondary | ICD-10-CM | POA: Diagnosis not present

## 2020-09-28 DIAGNOSIS — M79605 Pain in left leg: Secondary | ICD-10-CM | POA: Diagnosis not present

## 2020-09-28 DIAGNOSIS — I824Y2 Acute embolism and thrombosis of unspecified deep veins of left proximal lower extremity: Secondary | ICD-10-CM | POA: Diagnosis not present

## 2020-09-28 LAB — BASIC METABOLIC PANEL
Anion gap: 7 (ref 5–15)
BUN: 9 mg/dL (ref 6–20)
CO2: 27 mmol/L (ref 22–32)
Calcium: 9.5 mg/dL (ref 8.9–10.3)
Chloride: 107 mmol/L (ref 98–111)
Creatinine, Ser: 0.94 mg/dL (ref 0.61–1.24)
GFR, Estimated: >60 mL/min (ref >60)
Glucose, Bld: 93 mg/dL (ref 70–99)
Potassium: 4 mmol/L (ref 3.5–5.1)
Sodium: 141 mmol/L (ref 135–145)

## 2020-09-28 MED ORDER — APIXABAN 5 MG PO TABS
10.0000 mg | ORAL_TABLET | Freq: Once | ORAL | Status: AC
  Administered 2020-09-28: 10 mg via ORAL
  Filled 2020-09-28: qty 2

## 2020-09-28 MED ORDER — APIXABAN 5 MG PO TABS: 5.0000 mg | ORAL_TABLET | Freq: Two times a day (BID) | ORAL | Status: DC

## 2020-09-28 MED ORDER — APIXABAN 5 MG PO TABS: 10.0000 mg | ORAL_TABLET | Freq: Two times a day (BID) | ORAL | Status: DC

## 2020-09-28 MED ORDER — APIXABAN (ELIQUIS) VTE STARTER PACK (10MG AND 5MG): ORAL_TABLET | ORAL | 0 refills | Status: DC

## 2020-09-28 NOTE — Progress Notes (Signed)
ANTICOAGULATION CONSULT NOTE  Pharmacy Consult for apixaban Indication: DVT  No Known Allergies  Patient Measurements:   Heparin Dosing Weight:   Vital Signs: Temp: 98.2 F (36.8 C) (08/12 1021) Temp Source: Oral (08/12 1021) BP: 137/76 (08/12 1021) Pulse Rate: 62 (08/12 1021)  Labs: No results for input(s): HGB, HCT, PLT, APTT, LABPROT, INR, HEPARINUNFRC, HEPRLOWMOCWT, CREATININE, CKTOTAL, CKMB, TROPONINIHS in the last 72 hours.  CrCl cannot be calculated (Patient's most recent lab result is older than the maximum 21 days allowed.).   Medical History: Past Medical History:  Diagnosis Date   Allergic rhinitis    Anxiety    DVT (deep venous thrombosis) (HCC)    RLE 2002 aprox, (-) anticoag panel   GERD (gastroesophageal reflux disease)    OSA on CPAP    Palpitations    on betablokers   Assessment: 60 y/o M with a h/o DVT about 15 years ago previously on warfarin per patient admitted with new DVT.   Plan:  Eliquis 10 mg bid x 7 days followed by 5 mg bid thereafter.   Education completed  F/U baseline labs  Valentina Gu 09/28/2020,12:28 PM
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
## 2020-09-28 NOTE — Progress Notes (Signed)
Left lower extremity venous duplex completed. Refer to "CV Proc" under chart review to view preliminary results.  09/28/2020 12:14 PM Eula Fried., MHA, RVT, RDCS, RDMS

## 2020-09-28 NOTE — ED Triage Notes (Signed)
Pt complains of left calf pain x 3 days. Went to urgent care, sent here for ultrasound.

## 2020-09-28 NOTE — Discharge Instructions (Addendum)
Information on my medicine - ELIQUIS (apixaban)  This medication education was reviewed with me or my healthcare representative as part of my discharge preparation.  The pharmacist that spoke with me during my hospital stay was:  Valentina Gu [facility]  Why was Eliquis prescribed for you? Eliquis was prescribed to treat blood clots that may have been found in the veins of your legs (deep vein thrombosis) or in your lungs (pulmonary embolism) and to reduce the risk of them occurring again.  What do You need to know about Eliquis ? The starting dose is 10 mg (two 5 mg tablets) taken TWICE daily for the FIRST SEVEN (7) DAYS, then on 10/05/20  the dose is reduced to ONE 5 mg tablet taken TWICE daily.  Eliquis may be taken with or without food.   Try to take the dose about the same time in the morning and in the evening. If you have difficulty swallowing the tablet whole please discuss with your pharmacist how to take the medication safely.  Take Eliquis exactly as prescribed and DO NOT stop taking Eliquis without talking to the doctor who prescribed the medication.  Stopping may increase your risk of developing a new blood clot.  Refill your prescription before you run out.  After discharge, you should have regular check-up appointments with your healthcare provider that is prescribing your Eliquis.    What do you do if you miss a dose? If a dose of ELIQUIS is not taken at the scheduled time, take it as soon as possible on the same day and twice-daily administration should be resumed. The dose should not be doubled to make up for a missed dose.  Important Safety Information A possible side effect of Eliquis is bleeding. You should call your healthcare provider right away if you experience any of the following: Bleeding from an injury or your nose that does not stop. Unusual colored urine (red or dark brown) or unusual colored stools (red or black). Unusual bruising for unknown reasons. A  serious fall or if you hit your head (even if there is no bleeding).  Some medicines may interact with Eliquis and might increase your risk of bleeding or clotting while on Eliquis. To help avoid this, consult your healthcare provider or pharmacist prior to using any new prescription or non-prescription medications, including herbals, vitamins, non-steroidal anti-inflammatory drugs (NSAIDs) and supplements.  This website has more information on Eliquis (apixaban): http://www.eliquis.com/eliquis/home

## 2020-10-03 DIAGNOSIS — J3089 Other allergic rhinitis: Secondary | ICD-10-CM | POA: Diagnosis not present

## 2020-10-03 DIAGNOSIS — J301 Allergic rhinitis due to pollen: Secondary | ICD-10-CM | POA: Diagnosis not present

## 2020-10-04 ENCOUNTER — Encounter: Payer: Self-pay | Admitting: Internal Medicine

## 2020-10-04 ENCOUNTER — Ambulatory Visit: Payer: BC Managed Care – PPO | Admitting: Internal Medicine

## 2020-10-04 ENCOUNTER — Other Ambulatory Visit: Payer: Self-pay

## 2020-10-04 ENCOUNTER — Telehealth: Payer: Self-pay

## 2020-10-04 VITALS — BP 118/68 | HR 67 | Temp 97.6°F | Resp 16 | Ht 75.0 in | Wt 237.5 lb

## 2020-10-04 DIAGNOSIS — I824Y2 Acute embolism and thrombosis of unspecified deep veins of left proximal lower extremity: Secondary | ICD-10-CM | POA: Diagnosis not present

## 2020-10-04 MED ORDER — APIXABAN 5 MG PO TABS: 5.0000 mg | ORAL_TABLET | Freq: Two times a day (BID) | ORAL | 2 refills | Status: DC

## 2020-10-04 NOTE — Patient Instructions (Signed)
Continue apixaban 5 mg twice daily  Watch  for swelling or pain again on your lower extremities  We are referring you to the  hematologist, they are located on the cancer center

## 2020-10-04 NOTE — Telephone Encounter (Signed)
PA initiated via Covermymeds; KEY: [id number]. PA approved.   CaseId:[id number];Status:Approved;Review Type:Prior Auth;Coverage Start Date:09/04/2020;Coverage End Date:10/04/2021
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
## 2020-10-05 DIAGNOSIS — I824Y2 Acute embolism and thrombosis of unspecified deep veins of left proximal lower extremity: Secondary | ICD-10-CM | POA: Insufficient documentation

## 2020-10-05 NOTE — Assessment & Plan Note (Signed)
PLAN: DVT, second episode: Had R lower extremity DVT in 2002, at that time a coagulation panel was negative. Went to the ER 09/28/2020 with L calf pain, ultrasound showed DVT happening in the context of the patient being relatively inactive few weeks prior. He is now on anticoagulation, tolerating well. Plan:  Anticoagulation for 3 months at least Nonurgent hematology referral, does he need long-term anticoagulation?  Long-term low-dose anticoagulation?  Aspirin only?. He is aware he is high risk for future clots, strategies for prevention discussed. RTC for CPX 05/2021.

## 2020-10-24 ENCOUNTER — Other Ambulatory Visit: Payer: Self-pay | Admitting: Family

## 2020-10-24 DIAGNOSIS — J301 Allergic rhinitis due to pollen: Secondary | ICD-10-CM | POA: Diagnosis not present

## 2020-10-24 DIAGNOSIS — I824Y2 Acute embolism and thrombosis of unspecified deep veins of left proximal lower extremity: Secondary | ICD-10-CM

## 2020-10-24 DIAGNOSIS — J3089 Other allergic rhinitis: Secondary | ICD-10-CM | POA: Diagnosis not present

## 2020-10-25 ENCOUNTER — Encounter: Payer: Self-pay | Admitting: Family

## 2020-10-25 ENCOUNTER — Other Ambulatory Visit: Payer: Self-pay

## 2020-10-25 ENCOUNTER — Inpatient Hospital Stay: Payer: BC Managed Care – PPO | Attending: Hematology & Oncology

## 2020-10-25 ENCOUNTER — Inpatient Hospital Stay: Payer: BC Managed Care – PPO | Admitting: Family

## 2020-10-25 VITALS — BP 111/69 | HR 55 | Temp 98.2°F | Resp 20

## 2020-10-25 DIAGNOSIS — Z79899 Other long term (current) drug therapy: Secondary | ICD-10-CM | POA: Insufficient documentation

## 2020-10-25 DIAGNOSIS — I824Y2 Acute embolism and thrombosis of unspecified deep veins of left proximal lower extremity: Secondary | ICD-10-CM

## 2020-10-25 DIAGNOSIS — I82452 Acute embolism and thrombosis of left peroneal vein: Secondary | ICD-10-CM | POA: Diagnosis not present

## 2020-10-25 DIAGNOSIS — Z7901 Long term (current) use of anticoagulants: Secondary | ICD-10-CM | POA: Insufficient documentation

## 2020-10-25 LAB — CBC WITH DIFFERENTIAL (CANCER CENTER ONLY)
Abs Immature Granulocytes: 0.01 10*3/uL (ref 0.00–0.07)
Basophils Absolute: 0 10*3/uL (ref 0.0–0.1)
Basophils Relative: 1 %
Eosinophils Absolute: 0.1 10*3/uL (ref 0.0–0.5)
Eosinophils Relative: 1 %
HCT: 39.9 % (ref 39.0–52.0)
Hemoglobin: 13.1 g/dL (ref 13.0–17.0)
Immature Granulocytes: 0 %
Lymphocytes Relative: 39 %
Lymphs Abs: 1.5 10*3/uL (ref 0.7–4.0)
MCH: 30.3 pg (ref 26.0–34.0)
MCHC: 32.8 g/dL (ref 30.0–36.0)
MCV: 92.4 fL (ref 80.0–100.0)
Monocytes Absolute: 0.5 10*3/uL (ref 0.1–1.0)
Monocytes Relative: 13 %
Neutro Abs: 1.7 10*3/uL (ref 1.7–7.7)
Neutrophils Relative %: 46 %
Platelet Count: 163 10*3/uL (ref 150–400)
RBC: 4.32 MIL/uL (ref 4.22–5.81)
RDW: 11.5 % (ref 11.5–15.5)
WBC Count: 3.8 10*3/uL — ABNORMAL LOW (ref 4.0–10.5)
nRBC: 0 % (ref 0.0–0.2)

## 2020-10-25 LAB — IRON AND TIBC
Iron: 101 ug/dL (ref 42–163)
Saturation Ratios: 33 % (ref 20–55)
TIBC: 303 ug/dL (ref 202–409)
UIBC: 202 ug/dL (ref 117–376)

## 2020-10-25 LAB — CMP (CANCER CENTER ONLY)
ALT: 16 U/L (ref 0–44)
AST: 15 U/L (ref 15–41)
Albumin: 4.1 g/dL (ref 3.5–5.0)
Alkaline Phosphatase: 61 U/L (ref 38–126)
Anion gap: 9 (ref 5–15)
BUN: 12 mg/dL (ref 6–20)
CO2: 24 mmol/L (ref 22–32)
Calcium: 9.1 mg/dL (ref 8.9–10.3)
Chloride: 106 mmol/L (ref 98–111)
Creatinine: 0.91 mg/dL (ref 0.61–1.24)
GFR, Estimated: >60 mL/min (ref >60)
Glucose, Bld: 88 mg/dL (ref 70–99)
Potassium: 3.8 mmol/L (ref 3.5–5.1)
Sodium: 139 mmol/L (ref 135–145)
Total Bilirubin: 0.7 mg/dL (ref 0.3–1.2)
Total Protein: 6.9 g/dL (ref 6.5–8.1)

## 2020-10-25 LAB — D-DIMER, QUANTITATIVE: D-Dimer, Quant: 0.28 ug/mL-FEU (ref 0.00–0.50)

## 2020-10-25 LAB — SEDIMENTATION RATE: Sed Rate: 7 mm/hr (ref 0–16)

## 2020-10-25 LAB — ANTITHROMBIN III: AntiThromb III Func: 100 % (ref 75–120)

## 2020-10-25 LAB — RETICULOCYTES
Immature Retic Fract: 9.4 % (ref 2.3–15.9)
RBC.: 4.34 MIL/uL (ref 4.22–5.81)
Retic Count, Absolute: 76.4 10*3/uL (ref 19.0–186.0)
Retic Ct Pct: 1.8 % (ref 0.4–3.1)

## 2020-10-25 LAB — FERRITIN: Ferritin: 231 ng/mL (ref 24–336)

## 2020-10-25 LAB — LACTATE DEHYDROGENASE: LDH: 135 U/L (ref 98–192)
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
## 2020-10-26 ENCOUNTER — Telehealth: Payer: Self-pay | Admitting: *Deleted

## 2020-10-26 LAB — DRVVT CONFIRM: dRVVT Confirm: 1.3 ratio — ABNORMAL HIGH (ref 0.8–1.2)

## 2020-10-26 LAB — PROTEIN S, TOTAL: Protein S Ag, Total: 94 % (ref 60–150)

## 2020-10-26 LAB — LUPUS ANTICOAGULANT PANEL
DRVVT: 59.8 s — ABNORMAL HIGH (ref 0.0–47.0)
PTT Lupus Anticoagulant: 43.9 s (ref 0.0–51.9)

## 2020-10-26 LAB — DRVVT MIX: dRVVT Mix: 46.3 s — ABNORMAL HIGH (ref 0.0–40.4)

## 2020-10-26 LAB — PROTEIN C ACTIVITY: Protein C Activity: 101 % (ref 73–180)

## 2020-10-26 LAB — HOMOCYSTEINE: Homocysteine: 13.7 umol/L (ref 0.0–14.5)

## 2020-10-26 LAB — PROTEIN S ACTIVITY: Protein S Activity: 117 % (ref 63–140)

## 2020-10-26 NOTE — Telephone Encounter (Signed)
Per 10/25/20 los called to give upcoming appointments - confirmed

## 2020-10-27 LAB — PROTEIN C, TOTAL: Protein C, Total: 89 % (ref 60–150)

## 2020-10-29 LAB — FACTOR 5 LEIDEN

## 2020-10-30 LAB — BETA-2-GLYCOPROTEIN I ABS, IGG/M/A
Beta-2 Glyco I IgG: <9 GPI IgG units (ref 0–20)
Beta-2-Glycoprotein I IgA: <9 GPI IgA units (ref 0–25)
Beta-2-Glycoprotein I IgM: <9 GPI IgM units (ref 0–32)

## 2020-10-30 LAB — CARDIOLIPIN ANTIBODIES, IGG, IGM, IGA
Anticardiolipin IgA: <9 APL U/mL (ref 0–11)
Anticardiolipin IgG: <9 GPL U/mL (ref 0–14)
Anticardiolipin IgM: 9 MPL U/mL (ref 0–12)

## 2020-10-31 DIAGNOSIS — J3089 Other allergic rhinitis: Secondary | ICD-10-CM | POA: Diagnosis not present

## 2020-10-31 DIAGNOSIS — J301 Allergic rhinitis due to pollen: Secondary | ICD-10-CM | POA: Diagnosis not present

## 2020-10-31 LAB — PROTHROMBIN GENE MUTATION

## 2020-11-01 DIAGNOSIS — R350 Frequency of micturition: Secondary | ICD-10-CM | POA: Diagnosis not present

## 2020-11-01 DIAGNOSIS — N401 Enlarged prostate with lower urinary tract symptoms: Secondary | ICD-10-CM | POA: Diagnosis not present

## 2020-11-05 DIAGNOSIS — G4733 Obstructive sleep apnea (adult) (pediatric): Secondary | ICD-10-CM | POA: Diagnosis not present

## 2020-11-07 ENCOUNTER — Encounter: Payer: Self-pay | Admitting: Family

## 2020-11-07 DIAGNOSIS — J301 Allergic rhinitis due to pollen: Secondary | ICD-10-CM | POA: Diagnosis not present

## 2020-11-07 DIAGNOSIS — J3089 Other allergic rhinitis: Secondary | ICD-10-CM | POA: Diagnosis not present

## 2020-11-08 DIAGNOSIS — R350 Frequency of micturition: Secondary | ICD-10-CM | POA: Diagnosis not present

## 2020-11-08 DIAGNOSIS — R3912 Poor urinary stream: Secondary | ICD-10-CM | POA: Diagnosis not present

## 2020-11-09 ENCOUNTER — Telehealth: Payer: Self-pay | Admitting: Family

## 2020-11-09 DIAGNOSIS — I824Y2 Acute embolism and thrombosis of unspecified deep veins of left proximal lower extremity: Secondary | ICD-10-CM

## 2020-11-09 DIAGNOSIS — R760 Raised antibody titer: Secondary | ICD-10-CM

## 2020-11-09 NOTE — Telephone Encounter (Signed)
Spoke with patient regarding recent hyper coag panel and positive lupus anticoagulant. All questions answered. No changes at this time.  We will see him again in November for follow-up. Patient appreciative of call.

## 2020-11-14 DIAGNOSIS — J301 Allergic rhinitis due to pollen: Secondary | ICD-10-CM | POA: Diagnosis not present

## 2020-11-14 DIAGNOSIS — J3089 Other allergic rhinitis: Secondary | ICD-10-CM | POA: Diagnosis not present

## 2020-11-21 DIAGNOSIS — J301 Allergic rhinitis due to pollen: Secondary | ICD-10-CM | POA: Diagnosis not present

## 2020-11-21 DIAGNOSIS — J3089 Other allergic rhinitis: Secondary | ICD-10-CM | POA: Diagnosis not present

## 2020-12-05 DIAGNOSIS — G4733 Obstructive sleep apnea (adult) (pediatric): Secondary | ICD-10-CM | POA: Diagnosis not present

## 2020-12-12 DIAGNOSIS — J301 Allergic rhinitis due to pollen: Secondary | ICD-10-CM | POA: Diagnosis not present

## 2020-12-12 DIAGNOSIS — J3089 Other allergic rhinitis: Secondary | ICD-10-CM | POA: Diagnosis not present

## 2020-12-25 ENCOUNTER — Inpatient Hospital Stay: Payer: BC Managed Care – PPO | Admitting: Family

## 2020-12-25 ENCOUNTER — Other Ambulatory Visit: Payer: Self-pay

## 2020-12-25 ENCOUNTER — Telehealth: Payer: Self-pay | Admitting: *Deleted

## 2020-12-25 ENCOUNTER — Ambulatory Visit (HOSPITAL_BASED_OUTPATIENT_CLINIC_OR_DEPARTMENT_OTHER)
Admission: RE | Admit: 2020-12-25 | Discharge: 2020-12-25 | Disposition: A | Discharge status: Home / Self Care | Payer: BC Managed Care – PPO | Source: Ambulatory Visit | Attending: Family | Admitting: Family

## 2020-12-25 ENCOUNTER — Inpatient Hospital Stay: Payer: BC Managed Care – PPO | Attending: Hematology & Oncology

## 2020-12-25 ENCOUNTER — Other Ambulatory Visit (HOSPITAL_BASED_OUTPATIENT_CLINIC_OR_DEPARTMENT_OTHER): Payer: Self-pay

## 2020-12-25 VITALS — BP 120/89 | HR 52 | Temp 98.3°F | Resp 17 | Ht 75.0 in | Wt 238.0 lb

## 2020-12-25 DIAGNOSIS — Z7901 Long term (current) use of anticoagulants: Secondary | ICD-10-CM | POA: Diagnosis not present

## 2020-12-25 DIAGNOSIS — Z86718 Personal history of other venous thrombosis and embolism: Secondary | ICD-10-CM | POA: Diagnosis not present

## 2020-12-25 DIAGNOSIS — D6862 Lupus anticoagulant syndrome: Secondary | ICD-10-CM | POA: Insufficient documentation

## 2020-12-25 DIAGNOSIS — R760 Raised antibody titer: Secondary | ICD-10-CM | POA: Diagnosis not present

## 2020-12-25 DIAGNOSIS — I824Y2 Acute embolism and thrombosis of unspecified deep veins of left proximal lower extremity: Secondary | ICD-10-CM

## 2020-12-25 LAB — CMP (CANCER CENTER ONLY)
ALT: 19 U/L (ref 0–44)
AST: 20 U/L (ref 15–41)
Albumin: 4.2 g/dL (ref 3.5–5.0)
Alkaline Phosphatase: 59 U/L (ref 38–126)
Anion gap: 6 (ref 5–15)
BUN: 8 mg/dL (ref 6–20)
CO2: 28 mmol/L (ref 22–32)
Calcium: 9.5 mg/dL (ref 8.9–10.3)
Chloride: 106 mmol/L (ref 98–111)
Creatinine: 1.07 mg/dL (ref 0.61–1.24)
GFR, Estimated: >60 mL/min (ref >60)
Glucose, Bld: 88 mg/dL (ref 70–99)
Potassium: 3.7 mmol/L (ref 3.5–5.1)
Sodium: 140 mmol/L (ref 135–145)
Total Bilirubin: 1.2 mg/dL (ref 0.3–1.2)
Total Protein: 7.1 g/dL (ref 6.5–8.1)

## 2020-12-25 LAB — D-DIMER, QUANTITATIVE: D-Dimer, Quant: <0.27 ug/mL-FEU (ref 0.00–0.50)

## 2020-12-25 LAB — CBC WITH DIFFERENTIAL (CANCER CENTER ONLY)
Abs Immature Granulocytes: 0.02 10*3/uL (ref 0.00–0.07)
Basophils Absolute: 0 10*3/uL (ref 0.0–0.1)
Basophils Relative: 0 %
Eosinophils Absolute: 0.1 10*3/uL (ref 0.0–0.5)
Eosinophils Relative: 2 %
HCT: 39.7 % (ref 39.0–52.0)
Hemoglobin: 13.2 g/dL (ref 13.0–17.0)
Immature Granulocytes: 1 %
Lymphocytes Relative: 39 %
Lymphs Abs: 1.7 10*3/uL (ref 0.7–4.0)
MCH: 31.1 pg (ref 26.0–34.0)
MCHC: 33.2 g/dL (ref 30.0–36.0)
MCV: 93.4 fL (ref 80.0–100.0)
Monocytes Absolute: 0.6 10*3/uL (ref 0.1–1.0)
Monocytes Relative: 14 %
Neutro Abs: 2 10*3/uL (ref 1.7–7.7)
Neutrophils Relative %: 44 %
Platelet Count: 175 10*3/uL (ref 150–400)
RBC: 4.25 MIL/uL (ref 4.22–5.81)
RDW: 11.7 % (ref 11.5–15.5)
WBC Count: 4.4 10*3/uL (ref 4.0–10.5)
nRBC: 0 % (ref 0.0–0.2)

## 2020-12-25 MED ORDER — APIXABAN 5 MG PO TABS
5.0000 mg | ORAL_TABLET | Freq: Two times a day (BID) | ORAL | 5 refills | Status: DC
  Filled 2020-12-25: qty 60, 30d supply, fill #0
  Filled 2021-01-25 (×2): qty 60, 30d supply, fill #1
  Filled 2021-02-22: qty 60, 30d supply, fill #2
  Filled 2021-03-26: qty 60, 30d supply, fill #3
  Filled 2021-04-26: qty 60, 30d supply, fill #4
  Filled 2021-05-27: qty 60, 30d supply, fill #5
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
## 2020-12-25 NOTE — Telephone Encounter (Signed)
Per 12/25/20 los gave upcoming appointments - confirmed

## 2020-12-26 DIAGNOSIS — J3089 Other allergic rhinitis: Secondary | ICD-10-CM | POA: Diagnosis not present

## 2020-12-26 DIAGNOSIS — J301 Allergic rhinitis due to pollen: Secondary | ICD-10-CM | POA: Diagnosis not present

## 2020-12-27 LAB — LUPUS ANTICOAGULANT PANEL
DRVVT: 58.5 s — ABNORMAL HIGH (ref 0.0–47.0)
PTT Lupus Anticoagulant: 43 s (ref 0.0–51.9)

## 2020-12-27 LAB — DRVVT MIX: dRVVT Mix: 46.1 s — ABNORMAL HIGH (ref 0.0–40.4)

## 2020-12-27 LAB — DRVVT CONFIRM: dRVVT Confirm: 1.2 ratio (ref 0.8–1.2)

## 2020-12-31 ENCOUNTER — Other Ambulatory Visit (HOSPITAL_BASED_OUTPATIENT_CLINIC_OR_DEPARTMENT_OTHER): Payer: BC Managed Care – PPO

## 2021-01-05 DIAGNOSIS — G4733 Obstructive sleep apnea (adult) (pediatric): Secondary | ICD-10-CM | POA: Diagnosis not present

## 2021-01-17 DIAGNOSIS — H11153 Pinguecula, bilateral: Secondary | ICD-10-CM | POA: Diagnosis not present

## 2021-01-17 DIAGNOSIS — H2513 Age-related nuclear cataract, bilateral: Secondary | ICD-10-CM | POA: Diagnosis not present

## 2021-01-23 DIAGNOSIS — J301 Allergic rhinitis due to pollen: Secondary | ICD-10-CM | POA: Diagnosis not present

## 2021-01-23 DIAGNOSIS — J3089 Other allergic rhinitis: Secondary | ICD-10-CM | POA: Diagnosis not present

## 2021-01-25 ENCOUNTER — Other Ambulatory Visit (HOSPITAL_BASED_OUTPATIENT_CLINIC_OR_DEPARTMENT_OTHER): Payer: Self-pay

## 2021-01-30 DIAGNOSIS — J301 Allergic rhinitis due to pollen: Secondary | ICD-10-CM | POA: Diagnosis not present

## 2021-01-30 DIAGNOSIS — J3089 Other allergic rhinitis: Secondary | ICD-10-CM | POA: Diagnosis not present

## 2021-02-04 DIAGNOSIS — G4733 Obstructive sleep apnea (adult) (pediatric): Secondary | ICD-10-CM | POA: Diagnosis not present

## 2021-02-13 DIAGNOSIS — J3089 Other allergic rhinitis: Secondary | ICD-10-CM | POA: Diagnosis not present

## 2021-02-13 DIAGNOSIS — J301 Allergic rhinitis due to pollen: Secondary | ICD-10-CM | POA: Diagnosis not present

## 2021-02-13 DIAGNOSIS — J3081 Allergic rhinitis due to animal (cat) (dog) hair and dander: Secondary | ICD-10-CM | POA: Diagnosis not present

## 2021-02-22 ENCOUNTER — Other Ambulatory Visit (HOSPITAL_BASED_OUTPATIENT_CLINIC_OR_DEPARTMENT_OTHER): Payer: Self-pay

## 2021-02-27 DIAGNOSIS — J301 Allergic rhinitis due to pollen: Secondary | ICD-10-CM | POA: Diagnosis not present

## 2021-02-27 DIAGNOSIS — J3089 Other allergic rhinitis: Secondary | ICD-10-CM | POA: Diagnosis not present

## 2021-03-06 DIAGNOSIS — J3089 Other allergic rhinitis: Secondary | ICD-10-CM | POA: Diagnosis not present

## 2021-03-06 DIAGNOSIS — J301 Allergic rhinitis due to pollen: Secondary | ICD-10-CM | POA: Diagnosis not present

## 2021-03-07 DIAGNOSIS — G4733 Obstructive sleep apnea (adult) (pediatric): Secondary | ICD-10-CM | POA: Diagnosis not present

## 2021-03-13 DIAGNOSIS — J3089 Other allergic rhinitis: Secondary | ICD-10-CM | POA: Diagnosis not present

## 2021-03-13 DIAGNOSIS — J301 Allergic rhinitis due to pollen: Secondary | ICD-10-CM | POA: Diagnosis not present

## 2021-03-15 ENCOUNTER — Other Ambulatory Visit: Payer: Self-pay | Admitting: Internal Medicine

## 2021-03-20 DIAGNOSIS — J301 Allergic rhinitis due to pollen: Secondary | ICD-10-CM | POA: Diagnosis not present

## 2021-03-20 DIAGNOSIS — J3089 Other allergic rhinitis: Secondary | ICD-10-CM | POA: Diagnosis not present

## 2021-03-26 ENCOUNTER — Other Ambulatory Visit (HOSPITAL_BASED_OUTPATIENT_CLINIC_OR_DEPARTMENT_OTHER): Payer: Self-pay

## 2021-04-03 DIAGNOSIS — J3089 Other allergic rhinitis: Secondary | ICD-10-CM | POA: Diagnosis not present

## 2021-04-03 DIAGNOSIS — J301 Allergic rhinitis due to pollen: Secondary | ICD-10-CM | POA: Diagnosis not present

## 2021-04-07 DIAGNOSIS — G4733 Obstructive sleep apnea (adult) (pediatric): Secondary | ICD-10-CM | POA: Diagnosis not present

## 2021-04-15 DIAGNOSIS — J3081 Allergic rhinitis due to animal (cat) (dog) hair and dander: Secondary | ICD-10-CM | POA: Diagnosis not present

## 2021-04-15 DIAGNOSIS — J301 Allergic rhinitis due to pollen: Secondary | ICD-10-CM | POA: Diagnosis not present

## 2021-04-15 DIAGNOSIS — J3089 Other allergic rhinitis: Secondary | ICD-10-CM | POA: Diagnosis not present

## 2021-04-24 DIAGNOSIS — J3089 Other allergic rhinitis: Secondary | ICD-10-CM | POA: Diagnosis not present

## 2021-04-24 DIAGNOSIS — J301 Allergic rhinitis due to pollen: Secondary | ICD-10-CM | POA: Diagnosis not present

## 2021-04-26 ENCOUNTER — Other Ambulatory Visit (HOSPITAL_BASED_OUTPATIENT_CLINIC_OR_DEPARTMENT_OTHER): Payer: Self-pay

## 2021-05-01 DIAGNOSIS — J3089 Other allergic rhinitis: Secondary | ICD-10-CM | POA: Diagnosis not present

## 2021-05-01 DIAGNOSIS — J301 Allergic rhinitis due to pollen: Secondary | ICD-10-CM | POA: Diagnosis not present

## 2021-05-05 DIAGNOSIS — G4733 Obstructive sleep apnea (adult) (pediatric): Secondary | ICD-10-CM | POA: Diagnosis not present

## 2021-05-14 ENCOUNTER — Other Ambulatory Visit: Payer: Self-pay | Admitting: Internal Medicine

## 2021-05-14 DIAGNOSIS — S61213A Laceration without foreign body of left middle finger without damage to nail, initial encounter: Secondary | ICD-10-CM | POA: Diagnosis not present

## 2021-05-27 ENCOUNTER — Other Ambulatory Visit (HOSPITAL_BASED_OUTPATIENT_CLINIC_OR_DEPARTMENT_OTHER): Payer: Self-pay

## 2021-06-05 DIAGNOSIS — G4733 Obstructive sleep apnea (adult) (pediatric): Secondary | ICD-10-CM | POA: Diagnosis not present

## 2021-06-09 ENCOUNTER — Other Ambulatory Visit: Payer: Self-pay | Admitting: Internal Medicine

## 2021-06-10 DIAGNOSIS — J301 Allergic rhinitis due to pollen: Secondary | ICD-10-CM | POA: Diagnosis not present

## 2021-06-11 DIAGNOSIS — J3089 Other allergic rhinitis: Secondary | ICD-10-CM | POA: Diagnosis not present

## 2021-06-17 ENCOUNTER — Ambulatory Visit (INDEPENDENT_AMBULATORY_CARE_PROVIDER_SITE_OTHER): Payer: BC Managed Care – PPO | Admitting: Internal Medicine

## 2021-06-17 ENCOUNTER — Other Ambulatory Visit (HOSPITAL_BASED_OUTPATIENT_CLINIC_OR_DEPARTMENT_OTHER): Payer: Self-pay

## 2021-06-17 ENCOUNTER — Encounter: Payer: Self-pay | Admitting: Internal Medicine

## 2021-06-17 ENCOUNTER — Other Ambulatory Visit: Payer: Self-pay | Admitting: *Deleted

## 2021-06-17 VITALS — BP 118/68 | HR 65 | Temp 97.8°F | Resp 18 | Ht 75.0 in | Wt 241.5 lb

## 2021-06-17 DIAGNOSIS — Z0000 Encounter for general adult medical examination without abnormal findings: Secondary | ICD-10-CM

## 2021-06-17 DIAGNOSIS — I824Y2 Acute embolism and thrombosis of unspecified deep veins of left proximal lower extremity: Secondary | ICD-10-CM

## 2021-06-17 DIAGNOSIS — R760 Raised antibody titer: Secondary | ICD-10-CM

## 2021-06-17 LAB — LIPID PANEL
Cholesterol: 138 mg/dL (ref 0–200)
HDL: 39.3 mg/dL (ref >39.00)
LDL Cholesterol: 76 mg/dL (ref 0–99)
NonHDL: 98.74
Total CHOL/HDL Ratio: 4
Triglycerides: 113 mg/dL (ref 0.0–149.0)
VLDL: 22.6 mg/dL (ref 0.0–40.0)

## 2021-06-17 LAB — TSH: TSH: 2.94 u[IU]/mL (ref 0.35–5.50)

## 2021-06-17 MED ORDER — APIXABAN 5 MG PO TABS
5.0000 mg | ORAL_TABLET | Freq: Two times a day (BID) | ORAL | 5 refills | Status: DC
  Filled 2021-06-17 – 2021-06-21 (×2): qty 60, 30d supply, fill #0

## 2021-06-17 NOTE — Assessment & Plan Note (Signed)
--  Td 2015 ?-Shingrix : declined   ?-S/p Covid vax x 3, booster is an option ?--CCS: +FH ?Cscope 10-08 per pt (2 polyps) ?cscope 11-2011 ?cscope 03/2017 , neg, next 5 years (Dr Candelaria Stagers) ?--PSAs   was 0.4 (sept 2022) (per [facility]), sees urology ?-- Diet-exercise: Discussed the need to moderate carbohydrate intake, increase fruit, vegetables, beans.  He plans to exercise a little more ?-Labs: CMP, FLP ?- Advance care directives discussed ? ?  ?

## 2021-06-17 NOTE — Patient Instructions (Addendum)
? ? ?  GO TO THE LAB : Get the blood work   ? ? ?GO TO THE FRONT DESK, PLEASE SCHEDULE YOUR APPOINTMENTS ?Come back for   a physical exam in 1 year ? ?Please read the information about Advance Directives ?

## 2021-06-17 NOTE — Progress Notes (Signed)
? ?Subjective:  ? ? Patient ID: Ronnie [name], male    DOB: 03-24-1960, 61 y.o.   MRN: [id number] ? ?DOS:  06/17/2021 ?Type of visit - description: CPX ? ? ?Since the last office visit he is doing well. ?No major concerns. ?Does have occasional heartburn. ?Unable to lose weight ? ?Wt Readings from Last 3 Encounters:  ?06/17/21 241 lb 8 oz (109.5 kg)  ?12/25/20 238 lb (108 kg)  ?10/04/20 237 lb 8 oz (107.7 kg)  ? ? ? ?Review of Systems ? ?Other than above, a 14 point review of systems is negative  ?  ? ? ?Past Medical History:  ?Diagnosis Date  ? Allergic rhinitis   ? Anxiety   ? DVT (deep venous thrombosis) ([place])   ? RLE 2002 aprox, (-) anticoag panel  ? GERD (gastroesophageal reflux disease)   ? OSA on CPAP   ? Palpitations   ? on betablokers  ? ? ?Past Surgical History:  ?Procedure Laterality Date  ? NASAL SEPTOPLASTY W/ TURBINOPLASTY    ? TONSILLECTOMY    ? ?Social History  ? ?Socioeconomic History  ? Marital status: Married  ?  Spouse name: Not on file  ? Number of children: 1  ? Years of education: Not on file  ? Highest education level: Not on file  ?Occupational History  ? 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 Smoking status: Never  ? Smokeless tobacco: Never  ?Vaping Use  ? Vaping Use: Never used  ?Substance and Sexual Activity  ? Alcohol use: Yes  ?  Comment: rare  ? Drug use: No  ? Sexual activity: Not on file  ?Other Topics Concern  ? Not on file  ?Social History Narrative  ? Lives w/ wife  ? Child born 2000, gone to college   ? Lost father 01-2014  ?   ?   ?   ? ?Social Determinants of Health  ? ?Financial Resource Strain: Not on file  ?Food Insecurity: Not on file  ?Transportation Needs: Not on file  ?Physical Activity: Not on file  ?Stress: Not on file  ?Social Connections: Not on file  ?Intimate Partner Violence: Not on file  ? ? ? ?Current Outpatient Medications  ?Medication Instructions  ? apixaban (ELIQUIS) 5 mg, Oral, 2 times daily  ? cetirizine (ZYRTEC) 10 MG tablet Daily  ? cyclobenzaprine  (FLEXERIL) 10 MG tablet 1 tablet, 3 times daily  ? EPINEPHrine 0.3 mg/0.3 mL IJ SOAJ injection See admin instructions  ? metoprolol succinate (TOPROL-XL) 25 MG 24 hr tablet TAKE 1 TABLET BY MOUTH EVERY DAY AS NEEDED FOR PALPITATIONS  ? Multiple Vitamins-Minerals (MULTIVITAMIN PO) 1 tablet, Oral, Daily  ? NON FORMULARY 1 Dose, Injection, Weekly, Allergy Shot   ? Oregano 1,500 mg, Oral, Daily  ? pantoprazole (PROTONIX) 40 MG tablet TAKE 1 TABLET(40 MG) BY MOUTH DAILY BEFORE BREAKFAST  ? ? ?   ?Objective:  ? Physical Exam ?BP 118/68 (BP Location: Left Arm, Patient Position: Sitting, Cuff Size: Normal)   Pulse 65   Temp 97.8 ?F (36.6 ?C) (Oral)   Resp 18   Ht 6\' 3"  (1.905 m)   Wt 241 lb 8 oz (109.5 kg)   SpO2 98%   BMI 30.19 kg/m?  ?General: ?Well developed, NAD, BMI noted ?Neck: No  thyromegaly  ?HEENT:  ?Normocephalic . Face symmetric, atraumatic ?Lungs:  ?CTA B ?Normal respiratory effort, no intercostal retractions, no accessory muscle use. ?Heart: RRR,  no murmur.  ?Abdomen:  ?Not distended, soft, non-tender.  No rebound or rigidity.   ?Lower extremities: no pretibial edema bilaterally  ?Skin: Exposed areas without rash. Not pale. Not jaundice ?Neurologic:  ?alert & oriented X3.  ?Speech normal, gait appropriate for age and unassisted ?Strength symmetric and appropriate for age.  ?Psych: ?Cognition and judgment appear intact.  ?Cooperative with normal attention span and concentration.  ?Behavior appropriate. ?No anxious or depressed appearing. ? ?   ?Assessment   ? ? Assessment ?Palpitations on bb qd ?OSA, on CPAP ?GERD ?Allergic rhinitis, weekly shots, Dr Donneta Romberg ?DVT, RLE 2002 , (-)  coagulation panel; DVT #2 2022 -- (+) lupus anticoagulant ?H/o anxiety ? ?PLAN: ?Here for CPX ?Palpitations: On beta-blockers ?OSA: Good CPAP compliance ?History of DVT x2: Anticoagulated, to see hematology soon for a follow-up ?RTC 1 year  ? ? ? ? ?

## 2021-06-17 NOTE — Assessment & Plan Note (Signed)
Here for CPX ?Palpitations: On beta-blockers ?OSA: Good CPAP compliance ?History of DVT x2: Anticoagulated, to see hematology soon for a follow-up ?RTC 1 year  ? ?

## 2021-06-21 ENCOUNTER — Other Ambulatory Visit (HOSPITAL_BASED_OUTPATIENT_CLINIC_OR_DEPARTMENT_OTHER): Payer: Self-pay

## 2021-06-24 ENCOUNTER — Other Ambulatory Visit: Payer: BC Managed Care – PPO

## 2021-06-24 ENCOUNTER — Ambulatory Visit: Payer: BC Managed Care – PPO | Admitting: Family

## 2021-07-02 ENCOUNTER — Inpatient Hospital Stay: Payer: BC Managed Care – PPO | Admitting: Family

## 2021-07-02 ENCOUNTER — Inpatient Hospital Stay: Payer: BC Managed Care – PPO | Attending: Hematology & Oncology

## 2021-07-02 ENCOUNTER — Other Ambulatory Visit (HOSPITAL_BASED_OUTPATIENT_CLINIC_OR_DEPARTMENT_OTHER): Payer: Self-pay

## 2021-07-02 ENCOUNTER — Encounter: Payer: Self-pay | Admitting: Family

## 2021-07-02 VITALS — BP 117/72 | HR 52 | Temp 97.7°F | Resp 18 | Wt 242.4 lb

## 2021-07-02 DIAGNOSIS — D6862 Lupus anticoagulant syndrome: Secondary | ICD-10-CM | POA: Insufficient documentation

## 2021-07-02 DIAGNOSIS — I824Y2 Acute embolism and thrombosis of unspecified deep veins of left proximal lower extremity: Secondary | ICD-10-CM

## 2021-07-02 DIAGNOSIS — Z86718 Personal history of other venous thrombosis and embolism: Secondary | ICD-10-CM | POA: Insufficient documentation

## 2021-07-02 DIAGNOSIS — Z7901 Long term (current) use of anticoagulants: Secondary | ICD-10-CM | POA: Insufficient documentation

## 2021-07-02 DIAGNOSIS — R760 Raised antibody titer: Secondary | ICD-10-CM | POA: Diagnosis not present

## 2021-07-02 LAB — CMP (CANCER CENTER ONLY)
ALT: 21 U/L (ref 0–44)
AST: 24 U/L (ref 15–41)
Albumin: 4 g/dL (ref 3.5–5.0)
Alkaline Phosphatase: 64 U/L (ref 38–126)
Anion gap: 5 (ref 5–15)
BUN: 8 mg/dL (ref 6–20)
CO2: 25 mmol/L (ref 22–32)
Calcium: 8.8 mg/dL — ABNORMAL LOW (ref 8.9–10.3)
Chloride: 108 mmol/L (ref 98–111)
Creatinine: 0.99 mg/dL (ref 0.61–1.24)
GFR, Estimated: >60 mL/min (ref >60)
Glucose, Bld: 83 mg/dL (ref 70–99)
Potassium: 4.2 mmol/L (ref 3.5–5.1)
Sodium: 138 mmol/L (ref 135–145)
Total Bilirubin: 0.7 mg/dL (ref 0.3–1.2)
Total Protein: 7.1 g/dL (ref 6.5–8.1)

## 2021-07-02 LAB — CBC WITH DIFFERENTIAL (CANCER CENTER ONLY)
Abs Immature Granulocytes: 0.01 10*3/uL (ref 0.00–0.07)
Basophils Absolute: 0 10*3/uL (ref 0.0–0.1)
Basophils Relative: 1 %
Eosinophils Absolute: 0 10*3/uL (ref 0.0–0.5)
Eosinophils Relative: 1 %
HCT: 40.8 % (ref 39.0–52.0)
Hemoglobin: 13.2 g/dL (ref 13.0–17.0)
Immature Granulocytes: 0 %
Lymphocytes Relative: 44 %
Lymphs Abs: 1.7 10*3/uL (ref 0.7–4.0)
MCH: 30.8 pg (ref 26.0–34.0)
MCHC: 32.4 g/dL (ref 30.0–36.0)
MCV: 95.1 fL (ref 80.0–100.0)
Monocytes Absolute: 0.5 10*3/uL (ref 0.1–1.0)
Monocytes Relative: 13 %
Neutro Abs: 1.6 10*3/uL — ABNORMAL LOW (ref 1.7–7.7)
Neutrophils Relative %: 41 %
Platelet Count: 171 10*3/uL (ref 150–400)
RBC: 4.29 MIL/uL (ref 4.22–5.81)
RDW: 12 % (ref 11.5–15.5)
WBC Count: 3.9 10*3/uL — ABNORMAL LOW (ref 4.0–10.5)
nRBC: 0 % (ref 0.0–0.2)

## 2021-07-02 LAB — D-DIMER, QUANTITATIVE: D-Dimer, Quant: <0.27 ug/mL-FEU (ref 0.00–0.50)

## 2021-07-02 MED ORDER — APIXABAN 2.5 MG PO TABS
2.5000 mg | ORAL_TABLET | Freq: Two times a day (BID) | ORAL | 5 refills | Status: DC
  Filled 2021-07-02 – 2021-08-16 (×2): qty 60, 30d supply, fill #0
  Filled 2021-09-17: qty 60, 30d supply, fill #1
  Filled 2021-10-17: qty 60, 30d supply, fill #2
  Filled 2021-11-15: qty 60, 30d supply, fill #3
  Filled 2021-12-13: qty 60, 30d supply, fill #4
  Filled 2022-01-15: qty 60, 30d supply, fill #5
  Filled 2022-02-14: qty 6, 3d supply, fill #6
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
## 2021-07-04 LAB — LUPUS ANTICOAGULANT PANEL
DRVVT: 63.4 s — ABNORMAL HIGH (ref 0.0–47.0)
PTT Lupus Anticoagulant: 41.9 s (ref 0.0–43.5)

## 2021-07-04 LAB — DRVVT CONFIRM: dRVVT Confirm: 1.2 ratio (ref 0.8–1.2)

## 2021-07-04 LAB — DRVVT MIX: dRVVT Mix: 48 s — ABNORMAL HIGH (ref 0.0–40.4)

## 2021-07-05 DIAGNOSIS — G4733 Obstructive sleep apnea (adult) (pediatric): Secondary | ICD-10-CM | POA: Diagnosis not present

## 2021-07-11 ENCOUNTER — Other Ambulatory Visit (HOSPITAL_BASED_OUTPATIENT_CLINIC_OR_DEPARTMENT_OTHER): Payer: Self-pay

## 2021-07-17 DIAGNOSIS — J301 Allergic rhinitis due to pollen: Secondary | ICD-10-CM | POA: Diagnosis not present

## 2021-07-17 DIAGNOSIS — J3089 Other allergic rhinitis: Secondary | ICD-10-CM | POA: Diagnosis not present

## 2021-07-17 DIAGNOSIS — J3081 Allergic rhinitis due to animal (cat) (dog) hair and dander: Secondary | ICD-10-CM | POA: Diagnosis not present

## 2021-07-23 DIAGNOSIS — T781XXD Other adverse food reactions, not elsewhere classified, subsequent encounter: Secondary | ICD-10-CM | POA: Diagnosis not present

## 2021-07-23 DIAGNOSIS — J301 Allergic rhinitis due to pollen: Secondary | ICD-10-CM | POA: Diagnosis not present

## 2021-07-23 DIAGNOSIS — J3089 Other allergic rhinitis: Secondary | ICD-10-CM | POA: Diagnosis not present

## 2021-07-23 DIAGNOSIS — H1045 Other chronic allergic conjunctivitis: Secondary | ICD-10-CM | POA: Diagnosis not present

## 2021-07-31 DIAGNOSIS — J3089 Other allergic rhinitis: Secondary | ICD-10-CM | POA: Diagnosis not present

## 2021-07-31 DIAGNOSIS — J301 Allergic rhinitis due to pollen: Secondary | ICD-10-CM | POA: Diagnosis not present

## 2021-07-31 DIAGNOSIS — J3081 Allergic rhinitis due to animal (cat) (dog) hair and dander: Secondary | ICD-10-CM | POA: Diagnosis not present

## 2021-08-05 DIAGNOSIS — G4733 Obstructive sleep apnea (adult) (pediatric): Secondary | ICD-10-CM | POA: Diagnosis not present

## 2021-08-16 ENCOUNTER — Other Ambulatory Visit (HOSPITAL_BASED_OUTPATIENT_CLINIC_OR_DEPARTMENT_OTHER): Payer: Self-pay

## 2021-08-21 DIAGNOSIS — J301 Allergic rhinitis due to pollen: Secondary | ICD-10-CM | POA: Diagnosis not present

## 2021-08-21 DIAGNOSIS — J3089 Other allergic rhinitis: Secondary | ICD-10-CM | POA: Diagnosis not present

## 2021-08-28 DIAGNOSIS — J3081 Allergic rhinitis due to animal (cat) (dog) hair and dander: Secondary | ICD-10-CM | POA: Diagnosis not present

## 2021-08-28 DIAGNOSIS — J301 Allergic rhinitis due to pollen: Secondary | ICD-10-CM | POA: Diagnosis not present

## 2021-08-28 DIAGNOSIS — J3089 Other allergic rhinitis: Secondary | ICD-10-CM | POA: Diagnosis not present

## 2021-09-03 ENCOUNTER — Other Ambulatory Visit: Payer: Self-pay | Admitting: Internal Medicine

## 2021-09-04 DIAGNOSIS — J301 Allergic rhinitis due to pollen: Secondary | ICD-10-CM | POA: Diagnosis not present

## 2021-09-04 DIAGNOSIS — J3089 Other allergic rhinitis: Secondary | ICD-10-CM | POA: Diagnosis not present

## 2021-09-04 DIAGNOSIS — G4733 Obstructive sleep apnea (adult) (pediatric): Secondary | ICD-10-CM | POA: Diagnosis not present

## 2021-09-04 DIAGNOSIS — J3081 Allergic rhinitis due to animal (cat) (dog) hair and dander: Secondary | ICD-10-CM | POA: Diagnosis not present

## 2021-09-11 DIAGNOSIS — J301 Allergic rhinitis due to pollen: Secondary | ICD-10-CM | POA: Diagnosis not present

## 2021-09-11 DIAGNOSIS — J3089 Other allergic rhinitis: Secondary | ICD-10-CM | POA: Diagnosis not present

## 2021-09-11 DIAGNOSIS — J3081 Allergic rhinitis due to animal (cat) (dog) hair and dander: Secondary | ICD-10-CM | POA: Diagnosis not present

## 2021-09-17 ENCOUNTER — Other Ambulatory Visit (HOSPITAL_BASED_OUTPATIENT_CLINIC_OR_DEPARTMENT_OTHER): Payer: Self-pay

## 2021-09-18 DIAGNOSIS — J3081 Allergic rhinitis due to animal (cat) (dog) hair and dander: Secondary | ICD-10-CM | POA: Diagnosis not present

## 2021-09-18 DIAGNOSIS — J3089 Other allergic rhinitis: Secondary | ICD-10-CM | POA: Diagnosis not present

## 2021-09-18 DIAGNOSIS — J301 Allergic rhinitis due to pollen: Secondary | ICD-10-CM | POA: Diagnosis not present

## 2021-09-22 DIAGNOSIS — M7122 Synovial cyst of popliteal space [Baker], left knee: Secondary | ICD-10-CM | POA: Diagnosis not present

## 2021-10-02 DIAGNOSIS — J301 Allergic rhinitis due to pollen: Secondary | ICD-10-CM | POA: Diagnosis not present

## 2021-10-02 DIAGNOSIS — J3081 Allergic rhinitis due to animal (cat) (dog) hair and dander: Secondary | ICD-10-CM | POA: Diagnosis not present

## 2021-10-02 DIAGNOSIS — J3089 Other allergic rhinitis: Secondary | ICD-10-CM | POA: Diagnosis not present

## 2021-10-05 DIAGNOSIS — G4733 Obstructive sleep apnea (adult) (pediatric): Secondary | ICD-10-CM | POA: Diagnosis not present

## 2021-10-09 DIAGNOSIS — J3081 Allergic rhinitis due to animal (cat) (dog) hair and dander: Secondary | ICD-10-CM | POA: Diagnosis not present

## 2021-10-09 DIAGNOSIS — J3089 Other allergic rhinitis: Secondary | ICD-10-CM | POA: Diagnosis not present

## 2021-10-09 DIAGNOSIS — J301 Allergic rhinitis due to pollen: Secondary | ICD-10-CM | POA: Diagnosis not present

## 2021-10-16 DIAGNOSIS — J3089 Other allergic rhinitis: Secondary | ICD-10-CM | POA: Diagnosis not present

## 2021-10-16 DIAGNOSIS — J301 Allergic rhinitis due to pollen: Secondary | ICD-10-CM | POA: Diagnosis not present

## 2021-10-16 DIAGNOSIS — J3081 Allergic rhinitis due to animal (cat) (dog) hair and dander: Secondary | ICD-10-CM | POA: Diagnosis not present

## 2021-10-17 ENCOUNTER — Other Ambulatory Visit (HOSPITAL_BASED_OUTPATIENT_CLINIC_OR_DEPARTMENT_OTHER): Payer: Self-pay

## 2021-10-30 DIAGNOSIS — J301 Allergic rhinitis due to pollen: Secondary | ICD-10-CM | POA: Diagnosis not present

## 2021-10-30 DIAGNOSIS — J3089 Other allergic rhinitis: Secondary | ICD-10-CM | POA: Diagnosis not present

## 2021-10-30 DIAGNOSIS — J3081 Allergic rhinitis due to animal (cat) (dog) hair and dander: Secondary | ICD-10-CM | POA: Diagnosis not present

## 2021-11-02 ENCOUNTER — Other Ambulatory Visit: Payer: Self-pay | Admitting: Internal Medicine

## 2021-11-05 DIAGNOSIS — G4733 Obstructive sleep apnea (adult) (pediatric): Secondary | ICD-10-CM | POA: Diagnosis not present

## 2021-11-06 DIAGNOSIS — J301 Allergic rhinitis due to pollen: Secondary | ICD-10-CM | POA: Diagnosis not present

## 2021-11-06 DIAGNOSIS — J3089 Other allergic rhinitis: Secondary | ICD-10-CM | POA: Diagnosis not present

## 2021-11-06 DIAGNOSIS — J3081 Allergic rhinitis due to animal (cat) (dog) hair and dander: Secondary | ICD-10-CM | POA: Diagnosis not present

## 2021-11-07 DIAGNOSIS — N401 Enlarged prostate with lower urinary tract symptoms: Secondary | ICD-10-CM | POA: Diagnosis not present

## 2021-11-07 DIAGNOSIS — R350 Frequency of micturition: Secondary | ICD-10-CM | POA: Diagnosis not present

## 2021-11-15 ENCOUNTER — Other Ambulatory Visit (HOSPITAL_BASED_OUTPATIENT_CLINIC_OR_DEPARTMENT_OTHER): Payer: Self-pay

## 2021-11-20 DIAGNOSIS — J3089 Other allergic rhinitis: Secondary | ICD-10-CM | POA: Diagnosis not present

## 2021-11-20 DIAGNOSIS — J301 Allergic rhinitis due to pollen: Secondary | ICD-10-CM | POA: Diagnosis not present

## 2021-12-04 DIAGNOSIS — J3089 Other allergic rhinitis: Secondary | ICD-10-CM | POA: Diagnosis not present

## 2021-12-04 DIAGNOSIS — J301 Allergic rhinitis due to pollen: Secondary | ICD-10-CM | POA: Diagnosis not present

## 2021-12-04 DIAGNOSIS — J3081 Allergic rhinitis due to animal (cat) (dog) hair and dander: Secondary | ICD-10-CM | POA: Diagnosis not present

## 2021-12-05 DIAGNOSIS — G4733 Obstructive sleep apnea (adult) (pediatric): Secondary | ICD-10-CM | POA: Diagnosis not present

## 2021-12-11 DIAGNOSIS — J301 Allergic rhinitis due to pollen: Secondary | ICD-10-CM | POA: Diagnosis not present

## 2021-12-11 DIAGNOSIS — J3089 Other allergic rhinitis: Secondary | ICD-10-CM | POA: Diagnosis not present

## 2021-12-13 ENCOUNTER — Other Ambulatory Visit (HOSPITAL_BASED_OUTPATIENT_CLINIC_OR_DEPARTMENT_OTHER): Payer: Self-pay

## 2021-12-18 DIAGNOSIS — J301 Allergic rhinitis due to pollen: Secondary | ICD-10-CM | POA: Diagnosis not present

## 2021-12-18 DIAGNOSIS — J3089 Other allergic rhinitis: Secondary | ICD-10-CM | POA: Diagnosis not present

## 2021-12-25 DIAGNOSIS — J3081 Allergic rhinitis due to animal (cat) (dog) hair and dander: Secondary | ICD-10-CM | POA: Diagnosis not present

## 2021-12-25 DIAGNOSIS — J3089 Other allergic rhinitis: Secondary | ICD-10-CM | POA: Diagnosis not present

## 2021-12-25 DIAGNOSIS — J301 Allergic rhinitis due to pollen: Secondary | ICD-10-CM | POA: Diagnosis not present

## 2021-12-31 ENCOUNTER — Encounter: Payer: Self-pay | Admitting: Family

## 2021-12-31 ENCOUNTER — Inpatient Hospital Stay: Payer: BC Managed Care – PPO | Attending: Family

## 2021-12-31 ENCOUNTER — Inpatient Hospital Stay (HOSPITAL_BASED_OUTPATIENT_CLINIC_OR_DEPARTMENT_OTHER): Payer: BC Managed Care – PPO | Admitting: Family

## 2021-12-31 VITALS — BP 109/77 | HR 58 | Temp 98.2°F | Resp 17 | Wt 244.4 lb

## 2021-12-31 DIAGNOSIS — I824Y2 Acute embolism and thrombosis of unspecified deep veins of left proximal lower extremity: Secondary | ICD-10-CM | POA: Diagnosis not present

## 2021-12-31 DIAGNOSIS — D6862 Lupus anticoagulant syndrome: Secondary | ICD-10-CM | POA: Insufficient documentation

## 2021-12-31 DIAGNOSIS — Z7901 Long term (current) use of anticoagulants: Secondary | ICD-10-CM | POA: Diagnosis not present

## 2021-12-31 DIAGNOSIS — R760 Raised antibody titer: Secondary | ICD-10-CM

## 2021-12-31 DIAGNOSIS — Z86718 Personal history of other venous thrombosis and embolism: Secondary | ICD-10-CM | POA: Insufficient documentation

## 2021-12-31 LAB — CBC WITH DIFFERENTIAL (CANCER CENTER ONLY)
Abs Immature Granulocytes: 0.02 10*3/uL (ref 0.00–0.07)
Basophils Absolute: 0 10*3/uL (ref 0.0–0.1)
Basophils Relative: 0 %
Eosinophils Absolute: 0 10*3/uL (ref 0.0–0.5)
Eosinophils Relative: 1 %
HCT: 40.8 % (ref 39.0–52.0)
Hemoglobin: 13.4 g/dL (ref 13.0–17.0)
Immature Granulocytes: 0 %
Lymphocytes Relative: 34 %
Lymphs Abs: 1.6 10*3/uL (ref 0.7–4.0)
MCH: 30.8 pg (ref 26.0–34.0)
MCHC: 32.8 g/dL (ref 30.0–36.0)
MCV: 93.8 fL (ref 80.0–100.0)
Monocytes Absolute: 0.5 10*3/uL (ref 0.1–1.0)
Monocytes Relative: 12 %
Neutro Abs: 2.5 10*3/uL (ref 1.7–7.7)
Neutrophils Relative %: 53 %
Platelet Count: 171 10*3/uL (ref 150–400)
RBC: 4.35 MIL/uL (ref 4.22–5.81)
RDW: 11.4 % — ABNORMAL LOW (ref 11.5–15.5)
WBC Count: 4.7 10*3/uL (ref 4.0–10.5)
nRBC: 0 % (ref 0.0–0.2)

## 2021-12-31 LAB — CMP (CANCER CENTER ONLY)
ALT: 17 U/L (ref 0–44)
AST: 16 U/L (ref 15–41)
Albumin: 4.2 g/dL (ref 3.5–5.0)
Alkaline Phosphatase: 66 U/L (ref 38–126)
Anion gap: 7 (ref 5–15)
BUN: 10 mg/dL (ref 6–20)
CO2: 27 mmol/L (ref 22–32)
Calcium: 9.2 mg/dL (ref 8.9–10.3)
Chloride: 105 mmol/L (ref 98–111)
Creatinine: 1.03 mg/dL (ref 0.61–1.24)
GFR, Estimated: >60 mL/min (ref >60)
Glucose, Bld: 98 mg/dL (ref 70–99)
Potassium: 3.8 mmol/L (ref 3.5–5.1)
Sodium: 139 mmol/L (ref 135–145)
Total Bilirubin: 0.8 mg/dL (ref 0.3–1.2)
Total Protein: 7.1 g/dL (ref 6.5–8.1)

## 2021-12-31 LAB — D-DIMER, QUANTITATIVE: D-Dimer, Quant: <0.27 ug/mL-FEU (ref 0.00–0.50)
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
## 2022-01-01 DIAGNOSIS — J3089 Other allergic rhinitis: Secondary | ICD-10-CM | POA: Diagnosis not present

## 2022-01-01 DIAGNOSIS — J301 Allergic rhinitis due to pollen: Secondary | ICD-10-CM | POA: Diagnosis not present

## 2022-01-01 LAB — LUPUS ANTICOAGULANT PANEL
DRVVT: 53.3 s — ABNORMAL HIGH (ref 0.0–47.0)
PTT Lupus Anticoagulant: 42.1 s (ref 0.0–43.5)

## 2022-01-01 LAB — DRVVT MIX: dRVVT Mix: 42.3 s — ABNORMAL HIGH (ref 0.0–40.4)

## 2022-01-01 LAB — DRVVT CONFIRM: dRVVT Confirm: 1.2 ratio (ref 0.8–1.2)

## 2022-01-07 DIAGNOSIS — J301 Allergic rhinitis due to pollen: Secondary | ICD-10-CM | POA: Diagnosis not present

## 2022-01-08 DIAGNOSIS — J3089 Other allergic rhinitis: Secondary | ICD-10-CM | POA: Diagnosis not present

## 2022-01-08 DIAGNOSIS — J3081 Allergic rhinitis due to animal (cat) (dog) hair and dander: Secondary | ICD-10-CM | POA: Diagnosis not present

## 2022-01-08 DIAGNOSIS — J301 Allergic rhinitis due to pollen: Secondary | ICD-10-CM | POA: Diagnosis not present

## 2022-01-15 ENCOUNTER — Other Ambulatory Visit (HOSPITAL_BASED_OUTPATIENT_CLINIC_OR_DEPARTMENT_OTHER): Payer: Self-pay

## 2022-01-15 DIAGNOSIS — J301 Allergic rhinitis due to pollen: Secondary | ICD-10-CM | POA: Diagnosis not present

## 2022-01-15 DIAGNOSIS — J3081 Allergic rhinitis due to animal (cat) (dog) hair and dander: Secondary | ICD-10-CM | POA: Diagnosis not present

## 2022-01-15 DIAGNOSIS — J3089 Other allergic rhinitis: Secondary | ICD-10-CM | POA: Diagnosis not present

## 2022-01-17 DIAGNOSIS — H2513 Age-related nuclear cataract, bilateral: Secondary | ICD-10-CM | POA: Diagnosis not present

## 2022-01-17 DIAGNOSIS — H11153 Pinguecula, bilateral: Secondary | ICD-10-CM | POA: Diagnosis not present

## 2022-01-24 DIAGNOSIS — J019 Acute sinusitis, unspecified: Secondary | ICD-10-CM | POA: Diagnosis not present

## 2022-02-12 DIAGNOSIS — J3089 Other allergic rhinitis: Secondary | ICD-10-CM | POA: Diagnosis not present

## 2022-02-12 DIAGNOSIS — J301 Allergic rhinitis due to pollen: Secondary | ICD-10-CM | POA: Diagnosis not present

## 2022-02-12 DIAGNOSIS — J3081 Allergic rhinitis due to animal (cat) (dog) hair and dander: Secondary | ICD-10-CM | POA: Diagnosis not present

## 2022-02-14 ENCOUNTER — Other Ambulatory Visit: Payer: Self-pay | Admitting: Family

## 2022-02-14 ENCOUNTER — Other Ambulatory Visit (HOSPITAL_BASED_OUTPATIENT_CLINIC_OR_DEPARTMENT_OTHER): Payer: Self-pay

## 2022-02-14 DIAGNOSIS — R760 Raised antibody titer: Secondary | ICD-10-CM

## 2022-02-14 DIAGNOSIS — I824Y2 Acute embolism and thrombosis of unspecified deep veins of left proximal lower extremity: Secondary | ICD-10-CM

## 2022-02-14 MED ORDER — APIXABAN 2.5 MG PO TABS
2.5000 mg | ORAL_TABLET | Freq: Two times a day (BID) | ORAL | 5 refills | Status: DC
  Filled 2022-02-14: qty 60, 30d supply, fill #0
  Filled 2022-03-14: qty 60, 30d supply, fill #1
  Filled 2022-04-16: qty 60, 30d supply, fill #2
  Filled 2022-05-15: qty 60, 30d supply, fill #3
  Filled 2022-06-13: qty 60, 30d supply, fill #4
  Filled 2022-07-08: qty 60, 30d supply, fill #5

## 2022-02-26 DIAGNOSIS — J3089 Other allergic rhinitis: Secondary | ICD-10-CM | POA: Diagnosis not present

## 2022-02-26 DIAGNOSIS — J301 Allergic rhinitis due to pollen: Secondary | ICD-10-CM | POA: Diagnosis not present

## 2022-03-02 ENCOUNTER — Other Ambulatory Visit: Payer: Self-pay | Admitting: Internal Medicine

## 2022-03-05 DIAGNOSIS — J301 Allergic rhinitis due to pollen: Secondary | ICD-10-CM | POA: Diagnosis not present

## 2022-03-05 DIAGNOSIS — J3089 Other allergic rhinitis: Secondary | ICD-10-CM | POA: Diagnosis not present

## 2022-03-11 DIAGNOSIS — G4733 Obstructive sleep apnea (adult) (pediatric): Secondary | ICD-10-CM | POA: Diagnosis not present

## 2022-03-12 DIAGNOSIS — J301 Allergic rhinitis due to pollen: Secondary | ICD-10-CM | POA: Diagnosis not present

## 2022-03-12 DIAGNOSIS — J3089 Other allergic rhinitis: Secondary | ICD-10-CM | POA: Diagnosis not present

## 2022-03-14 ENCOUNTER — Other Ambulatory Visit (HOSPITAL_BASED_OUTPATIENT_CLINIC_OR_DEPARTMENT_OTHER): Payer: Self-pay

## 2022-03-19 DIAGNOSIS — J3089 Other allergic rhinitis: Secondary | ICD-10-CM | POA: Diagnosis not present

## 2022-03-19 DIAGNOSIS — J301 Allergic rhinitis due to pollen: Secondary | ICD-10-CM | POA: Diagnosis not present

## 2022-03-26 DIAGNOSIS — J3089 Other allergic rhinitis: Secondary | ICD-10-CM | POA: Diagnosis not present

## 2022-03-26 DIAGNOSIS — R8271 Bacteriuria: Secondary | ICD-10-CM | POA: Diagnosis not present

## 2022-03-26 DIAGNOSIS — J3081 Allergic rhinitis due to animal (cat) (dog) hair and dander: Secondary | ICD-10-CM | POA: Diagnosis not present

## 2022-03-26 DIAGNOSIS — R310 Gross hematuria: Secondary | ICD-10-CM | POA: Diagnosis not present

## 2022-03-26 DIAGNOSIS — J301 Allergic rhinitis due to pollen: Secondary | ICD-10-CM | POA: Diagnosis not present

## 2022-03-26 DIAGNOSIS — R350 Frequency of micturition: Secondary | ICD-10-CM | POA: Diagnosis not present

## 2022-04-11 DIAGNOSIS — G4733 Obstructive sleep apnea (adult) (pediatric): Secondary | ICD-10-CM | POA: Diagnosis not present

## 2022-04-16 ENCOUNTER — Other Ambulatory Visit (HOSPITAL_BASED_OUTPATIENT_CLINIC_OR_DEPARTMENT_OTHER): Payer: Self-pay

## 2022-04-17 DIAGNOSIS — J3089 Other allergic rhinitis: Secondary | ICD-10-CM | POA: Diagnosis not present

## 2022-04-17 DIAGNOSIS — R310 Gross hematuria: Secondary | ICD-10-CM | POA: Diagnosis not present

## 2022-04-17 DIAGNOSIS — J3081 Allergic rhinitis due to animal (cat) (dog) hair and dander: Secondary | ICD-10-CM | POA: Diagnosis not present

## 2022-04-17 DIAGNOSIS — J301 Allergic rhinitis due to pollen: Secondary | ICD-10-CM | POA: Diagnosis not present

## 2022-04-22 DIAGNOSIS — R310 Gross hematuria: Secondary | ICD-10-CM | POA: Diagnosis not present

## 2022-04-22 DIAGNOSIS — K760 Fatty (change of) liver, not elsewhere classified: Secondary | ICD-10-CM | POA: Diagnosis not present

## 2022-04-23 DIAGNOSIS — J3081 Allergic rhinitis due to animal (cat) (dog) hair and dander: Secondary | ICD-10-CM | POA: Diagnosis not present

## 2022-04-23 DIAGNOSIS — J3089 Other allergic rhinitis: Secondary | ICD-10-CM | POA: Diagnosis not present

## 2022-04-23 DIAGNOSIS — J301 Allergic rhinitis due to pollen: Secondary | ICD-10-CM | POA: Diagnosis not present

## 2022-05-07 DIAGNOSIS — J3081 Allergic rhinitis due to animal (cat) (dog) hair and dander: Secondary | ICD-10-CM | POA: Diagnosis not present

## 2022-05-07 DIAGNOSIS — J301 Allergic rhinitis due to pollen: Secondary | ICD-10-CM | POA: Diagnosis not present

## 2022-05-07 DIAGNOSIS — J3089 Other allergic rhinitis: Secondary | ICD-10-CM | POA: Diagnosis not present

## 2022-05-10 DIAGNOSIS — G4733 Obstructive sleep apnea (adult) (pediatric): Secondary | ICD-10-CM | POA: Diagnosis not present

## 2022-05-14 ENCOUNTER — Other Ambulatory Visit (HOSPITAL_BASED_OUTPATIENT_CLINIC_OR_DEPARTMENT_OTHER): Payer: Self-pay

## 2022-05-15 ENCOUNTER — Other Ambulatory Visit (HOSPITAL_BASED_OUTPATIENT_CLINIC_OR_DEPARTMENT_OTHER): Payer: Self-pay

## 2022-05-21 DIAGNOSIS — J301 Allergic rhinitis due to pollen: Secondary | ICD-10-CM | POA: Diagnosis not present

## 2022-05-21 DIAGNOSIS — J3089 Other allergic rhinitis: Secondary | ICD-10-CM | POA: Diagnosis not present

## 2022-05-22 DIAGNOSIS — R310 Gross hematuria: Secondary | ICD-10-CM | POA: Diagnosis not present

## 2022-05-31 ENCOUNTER — Other Ambulatory Visit: Payer: Self-pay | Admitting: Internal Medicine

## 2022-06-04 DIAGNOSIS — J301 Allergic rhinitis due to pollen: Secondary | ICD-10-CM | POA: Diagnosis not present

## 2022-06-04 DIAGNOSIS — J3089 Other allergic rhinitis: Secondary | ICD-10-CM | POA: Diagnosis not present

## 2022-06-04 DIAGNOSIS — J3081 Allergic rhinitis due to animal (cat) (dog) hair and dander: Secondary | ICD-10-CM | POA: Diagnosis not present

## 2022-06-05 DIAGNOSIS — R609 Edema, unspecified: Secondary | ICD-10-CM | POA: Diagnosis not present

## 2022-06-13 ENCOUNTER — Other Ambulatory Visit (HOSPITAL_BASED_OUTPATIENT_CLINIC_OR_DEPARTMENT_OTHER): Payer: Self-pay

## 2022-06-19 ENCOUNTER — Encounter: Payer: BC Managed Care – PPO | Admitting: Internal Medicine

## 2022-06-24 ENCOUNTER — Inpatient Hospital Stay (HOSPITAL_BASED_OUTPATIENT_CLINIC_OR_DEPARTMENT_OTHER): Payer: BC Managed Care – PPO | Admitting: Family

## 2022-06-24 ENCOUNTER — Other Ambulatory Visit: Payer: Self-pay

## 2022-06-24 ENCOUNTER — Inpatient Hospital Stay: Payer: BC Managed Care – PPO | Attending: Hematology & Oncology

## 2022-06-24 ENCOUNTER — Encounter: Payer: Self-pay | Admitting: Family

## 2022-06-24 VITALS — BP 116/63 | HR 50 | Temp 97.9°F | Resp 18 | Ht 74.0 in | Wt 247.1 lb

## 2022-06-24 DIAGNOSIS — Z7901 Long term (current) use of anticoagulants: Secondary | ICD-10-CM | POA: Insufficient documentation

## 2022-06-24 DIAGNOSIS — R760 Raised antibody titer: Secondary | ICD-10-CM

## 2022-06-24 DIAGNOSIS — I824Y2 Acute embolism and thrombosis of unspecified deep veins of left proximal lower extremity: Secondary | ICD-10-CM | POA: Diagnosis not present

## 2022-06-24 DIAGNOSIS — Z86718 Personal history of other venous thrombosis and embolism: Secondary | ICD-10-CM | POA: Diagnosis not present

## 2022-06-24 DIAGNOSIS — D6862 Lupus anticoagulant syndrome: Secondary | ICD-10-CM | POA: Diagnosis not present

## 2022-06-24 LAB — CBC WITH DIFFERENTIAL (CANCER CENTER ONLY)
Abs Immature Granulocytes: 0.01 10*3/uL (ref 0.00–0.07)
Basophils Absolute: 0 10*3/uL (ref 0.0–0.1)
Basophils Relative: 1 %
Eosinophils Absolute: 0.1 10*3/uL (ref 0.0–0.5)
Eosinophils Relative: 2 %
HCT: 40.3 % (ref 39.0–52.0)
Hemoglobin: 13.1 g/dL (ref 13.0–17.0)
Immature Granulocytes: 0 %
Lymphocytes Relative: 41 %
Lymphs Abs: 1.7 10*3/uL (ref 0.7–4.0)
MCH: 30.3 pg (ref 26.0–34.0)
MCHC: 32.5 g/dL (ref 30.0–36.0)
MCV: 93.1 fL (ref 80.0–100.0)
Monocytes Absolute: 0.5 10*3/uL (ref 0.1–1.0)
Monocytes Relative: 11 %
Neutro Abs: 1.9 10*3/uL (ref 1.7–7.7)
Neutrophils Relative %: 45 %
Platelet Count: 174 10*3/uL (ref 150–400)
RBC: 4.33 MIL/uL (ref 4.22–5.81)
RDW: 11.8 % (ref 11.5–15.5)
WBC Count: 4.3 10*3/uL (ref 4.0–10.5)
nRBC: 0 % (ref 0.0–0.2)

## 2022-06-24 LAB — CMP (CANCER CENTER ONLY)
ALT: 18 U/L (ref 0–44)
AST: 19 U/L (ref 15–41)
Albumin: 4.3 g/dL (ref 3.5–5.0)
Alkaline Phosphatase: 67 U/L (ref 38–126)
Anion gap: 8 (ref 5–15)
BUN: 8 mg/dL (ref 6–20)
CO2: 25 mmol/L (ref 22–32)
Calcium: 9.4 mg/dL (ref 8.9–10.3)
Chloride: 106 mmol/L (ref 98–111)
Creatinine: 1.01 mg/dL (ref 0.61–1.24)
GFR, Estimated: >60 mL/min (ref >60)
Glucose, Bld: 99 mg/dL (ref 70–99)
Potassium: 3.6 mmol/L (ref 3.5–5.1)
Sodium: 139 mmol/L (ref 135–145)
Total Bilirubin: 0.6 mg/dL (ref 0.3–1.2)
Total Protein: 7.3 g/dL (ref 6.5–8.1)

## 2022-06-24 LAB — D-DIMER, QUANTITATIVE: D-Dimer, Quant: 0.3 ug/mL-FEU (ref 0.00–0.50)
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
## 2022-06-25 DIAGNOSIS — J301 Allergic rhinitis due to pollen: Secondary | ICD-10-CM | POA: Diagnosis not present

## 2022-06-25 DIAGNOSIS — J3089 Other allergic rhinitis: Secondary | ICD-10-CM | POA: Diagnosis not present

## 2022-06-26 LAB — PTT-LA MIX: PTT-LA Mix: 41.9 s — ABNORMAL HIGH (ref 0.0–40.5)

## 2022-06-26 LAB — DRVVT CONFIRM: dRVVT Confirm: 1.1 ratio (ref 0.8–1.2)

## 2022-06-26 LAB — LUPUS ANTICOAGULANT PANEL
DRVVT: 50.9 s — ABNORMAL HIGH (ref 0.0–47.0)
PTT Lupus Anticoagulant: 43.6 s — ABNORMAL HIGH (ref 0.0–43.5)

## 2022-06-26 LAB — HEXAGONAL PHASE PHOSPHOLIPID: Hexagonal Phase Phospholipid: 9 s (ref 0–11)

## 2022-06-26 LAB — DRVVT MIX: dRVVT Mix: 41.8 s — ABNORMAL HIGH (ref 0.0–40.4)

## 2022-07-01 ENCOUNTER — Ambulatory Visit: Payer: BC Managed Care – PPO | Admitting: Family

## 2022-07-01 ENCOUNTER — Other Ambulatory Visit: Payer: BC Managed Care – PPO

## 2022-07-08 ENCOUNTER — Other Ambulatory Visit (HOSPITAL_BASED_OUTPATIENT_CLINIC_OR_DEPARTMENT_OTHER): Payer: Self-pay

## 2022-07-08 ENCOUNTER — Encounter: Payer: Self-pay | Admitting: Internal Medicine

## 2022-07-08 ENCOUNTER — Ambulatory Visit (INDEPENDENT_AMBULATORY_CARE_PROVIDER_SITE_OTHER): Payer: BC Managed Care – PPO | Admitting: Internal Medicine

## 2022-07-08 VITALS — BP 122/68 | HR 54 | Temp 98.0°F | Resp 16 | Ht 74.0 in | Wt 243.5 lb

## 2022-07-08 DIAGNOSIS — Z1211 Encounter for screening for malignant neoplasm of colon: Secondary | ICD-10-CM

## 2022-07-08 DIAGNOSIS — Z0000 Encounter for general adult medical examination without abnormal findings: Secondary | ICD-10-CM

## 2022-07-08 LAB — LIPID PANEL
Cholesterol: 115 mg/dL (ref 0–200)
HDL: 39.5 mg/dL (ref >39.00)
LDL Cholesterol: 59 mg/dL (ref 0–99)
NonHDL: 75.18
Total CHOL/HDL Ratio: 3
Triglycerides: 81 mg/dL (ref 0.0–149.0)
VLDL: 16.2 mg/dL (ref 0.0–40.0)

## 2022-07-08 NOTE — Progress Notes (Unsigned)
Subjective:    Patient ID: Ronnie [name], male    DOB: 1960-04-20, 62 y.o.   MRN: [id number]  DOS:  07/08/2022 Type of visit - description: CPX  Here for CPX Feels well, no concerns   Review of Systems   A 14 point review of systems is negative    Past Medical History:  Diagnosis Date   Allergic rhinitis    Anxiety    DVT (deep venous thrombosis) ([facility])    RLE 2002 aprox, (-) anticoag panel   GERD (gastroesophageal reflux disease)    OSA on CPAP    Palpitations    on betablokers    Past Surgical History:  Procedure Laterality Date   NASAL SEPTOPLASTY W/ TURBINOPLASTY     TONSILLECTOMY     Social History   Socioeconomic History   Marital status: Married    Spouse name: Not on file   Number of children: 1   Years of education: Not on file   Highest education level: Not on file  Occupational History   Occupation: consulting work  Tobacco Use   Smoking status: Never   Smokeless tobacco: Never  Vaping Use   Vaping Use: Never used  Substance and Sexual Activity   Alcohol use: Yes    Comment: rare   Drug use: No   Sexual activity: Not on file  Other Topics Concern   Not on file  Social History Narrative   Lives w/ wife   Child born 2000, gone to college    Lost father 01-2014            Social Determinants of Health   Financial Resource Strain: Not on file  Food Insecurity: Not on file  Transportation Needs: Not on file  Physical Activity: Not on file  Stress: Not on file  Social Connections: Not on file  Intimate Partner Violence: Not on file    Current Outpatient Medications  Medication Instructions   cetirizine (ZYRTEC) 10 MG tablet Daily   Eliquis 2.5 mg, Oral, 2 times daily   EPINEPHrine 0.3 mg/0.3 mL IJ SOAJ injection See admin instructions   metoprolol succinate (TOPROL-XL) 25 MG 24 hr tablet TAKE 1 TABLET BY MOUTH EVERY DAY AS NEEDED FOR PALPITATIONS   Multiple Vitamins-Minerals (MULTIVITAMIN PO) 1 tablet, Oral, Daily   NON FORMULARY  1 Dose, Injection, Weekly, Allergy Shot    Oregano 1,500 mg, Oral, Daily   pantoprazole (PROTONIX) 40 MG tablet TAKE 1 TABLET(40 MG) BY MOUTH DAILY BEFORE BREAKFAST       Objective:   Physical Exam BP 122/68   Pulse (!) 54   Temp 98 F (36.7 C) (Oral)   Resp 16   Ht 6\' 2"  (1.88 m)   Wt 243 lb 8 oz (110.5 kg)   SpO2 95%   BMI 31.26 kg/m  General: Well developed, NAD, BMI noted Neck: No  thyromegaly  HEENT:  Normocephalic . Face symmetric, atraumatic Lungs:  CTA B Normal respiratory effort, no intercostal retractions, no accessory muscle use. Heart:bradycardic Abdomen:  Not distended, soft, non-tender. No rebound or rigidity.   Lower extremities: no pretibial edema bilaterally  Skin: Exposed areas without rash. Not pale. Not jaundice Neurologic:  alert & oriented X3.  Speech normal, gait appropriate for age and unassisted Strength symmetric and appropriate for age.  Psych: Cognition and judgment appear intact.  Cooperative with normal attention span and concentration.  Behavior appropriate. No anxious or depressed appearing.     Assessment   Assessment Palpitations on bb  qd OSA, on CPAP GERD Allergic rhinitis, weekly shots, Dr Horine Callas DVT, RLE 2002 , (-)  coagulation panel; DVT #2 2022 -- (+) lupus anticoagulant H/o anxiety  PLAN: Here for CPX, see separate documentation Palpitations: History of, beta-blockers, not an issue OSA: Reports good CPAP compliance + Lupus anticoagulant: LOV hematology 06/24/2022, was recommended to continue low-dose of Eliquis, 2.5 mg twice daily. RTC 1 year

## 2022-07-08 NOTE — Patient Instructions (Addendum)
Vaccines I recommend:  Covid booster if not done after 10-2021 Shingrix (shingles) Flu shot every fall  GO TO THE LAB : Get the blood work     GO TO THE FRONT DESK, PLEASE SCHEDULE YOUR APPOINTMENTS Come back for   a physical exam in 1 year    Please bring [place] a copy of your Healthcare Power of Attorney for your chart.

## 2022-07-09 ENCOUNTER — Encounter: Payer: Self-pay | Admitting: Internal Medicine

## 2022-07-09 NOTE — Assessment & Plan Note (Signed)
-  Td 2015 -Vaccines I recommend: Shingrix, COVID booster if not done after 10-2021, flu shot every fall --CCS: +FH Cscope 10-08 per pt (2 polyps) cscope 11-2011 cscope 03/2017 , neg, next 5 years . Due for recheck, referral sent to Dr. Kristopher Glee  office   --see urology, PSA 0.4 (10-2021) [facility] -Labs: FLP -- Diet-exercise: Doing great, for the last 4 weeks he is walking to 3 miles 3 times a week and has changed his diet, + weight loss at home. - Healthcare POA: Recommend to bring a copy

## 2022-07-09 NOTE — Assessment & Plan Note (Signed)
Here for CPX, see separate documentation Palpitations: History of, beta-blockers, not an issue OSA: Reports good CPAP compliance + Lupus anticoagulant: LOV hematology 06/24/2022, was recommended to continue low-dose of Eliquis, 2.5 mg twice daily. RTC 1 year

## 2022-07-21 DIAGNOSIS — J301 Allergic rhinitis due to pollen: Secondary | ICD-10-CM | POA: Diagnosis not present

## 2022-07-21 DIAGNOSIS — J3081 Allergic rhinitis due to animal (cat) (dog) hair and dander: Secondary | ICD-10-CM | POA: Diagnosis not present

## 2022-07-21 DIAGNOSIS — H1045 Other chronic allergic conjunctivitis: Secondary | ICD-10-CM | POA: Diagnosis not present

## 2022-07-21 DIAGNOSIS — J3089 Other allergic rhinitis: Secondary | ICD-10-CM | POA: Diagnosis not present

## 2022-08-06 DIAGNOSIS — J3089 Other allergic rhinitis: Secondary | ICD-10-CM | POA: Diagnosis not present

## 2022-08-06 DIAGNOSIS — J301 Allergic rhinitis due to pollen: Secondary | ICD-10-CM | POA: Diagnosis not present

## 2022-08-13 DIAGNOSIS — J3089 Other allergic rhinitis: Secondary | ICD-10-CM | POA: Diagnosis not present

## 2022-08-13 DIAGNOSIS — J301 Allergic rhinitis due to pollen: Secondary | ICD-10-CM | POA: Diagnosis not present

## 2022-08-13 DIAGNOSIS — J3081 Allergic rhinitis due to animal (cat) (dog) hair and dander: Secondary | ICD-10-CM | POA: Diagnosis not present

## 2022-08-15 ENCOUNTER — Other Ambulatory Visit: Payer: Self-pay | Admitting: Hematology & Oncology

## 2022-08-15 ENCOUNTER — Other Ambulatory Visit (HOSPITAL_BASED_OUTPATIENT_CLINIC_OR_DEPARTMENT_OTHER): Payer: Self-pay

## 2022-08-15 DIAGNOSIS — I824Y2 Acute embolism and thrombosis of unspecified deep veins of left proximal lower extremity: Secondary | ICD-10-CM

## 2022-08-15 DIAGNOSIS — R760 Raised antibody titer: Secondary | ICD-10-CM

## 2022-08-15 MED ORDER — APIXABAN 2.5 MG PO TABS
2.5000 mg | ORAL_TABLET | Freq: Two times a day (BID) | ORAL | 5 refills | Status: DC
Start: 2022-08-15 — End: 2023-02-13
  Filled 2022-08-15: qty 60, 30d supply, fill #0
  Filled 2022-09-16: qty 60, 30d supply, fill #1
  Filled 2022-10-15: qty 60, 30d supply, fill #2
  Filled 2022-11-14: qty 60, 30d supply, fill #3
  Filled 2022-12-16: qty 60, 30d supply, fill #4
  Filled 2023-01-14: qty 60, 30d supply, fill #5

## 2022-08-27 DIAGNOSIS — J301 Allergic rhinitis due to pollen: Secondary | ICD-10-CM | POA: Diagnosis not present

## 2022-08-27 DIAGNOSIS — J3081 Allergic rhinitis due to animal (cat) (dog) hair and dander: Secondary | ICD-10-CM | POA: Diagnosis not present

## 2022-08-27 DIAGNOSIS — J3089 Other allergic rhinitis: Secondary | ICD-10-CM | POA: Diagnosis not present

## 2022-08-29 ENCOUNTER — Other Ambulatory Visit: Payer: Self-pay | Admitting: Internal Medicine

## 2022-09-02 ENCOUNTER — Encounter: Payer: Self-pay | Admitting: Family

## 2022-09-03 DIAGNOSIS — J301 Allergic rhinitis due to pollen: Secondary | ICD-10-CM | POA: Diagnosis not present

## 2022-09-03 DIAGNOSIS — J3089 Other allergic rhinitis: Secondary | ICD-10-CM | POA: Diagnosis not present

## 2022-09-08 DIAGNOSIS — Z1211 Encounter for screening for malignant neoplasm of colon: Secondary | ICD-10-CM | POA: Diagnosis not present

## 2022-09-08 DIAGNOSIS — Z800 Family history of malignant neoplasm of digestive organs: Secondary | ICD-10-CM | POA: Diagnosis not present

## 2022-09-08 LAB — HM COLONOSCOPY

## 2022-09-09 ENCOUNTER — Encounter: Payer: Self-pay | Admitting: Internal Medicine

## 2022-09-10 DIAGNOSIS — J3089 Other allergic rhinitis: Secondary | ICD-10-CM | POA: Diagnosis not present

## 2022-09-10 DIAGNOSIS — J301 Allergic rhinitis due to pollen: Secondary | ICD-10-CM | POA: Diagnosis not present

## 2022-09-16 ENCOUNTER — Other Ambulatory Visit (HOSPITAL_BASED_OUTPATIENT_CLINIC_OR_DEPARTMENT_OTHER): Payer: Self-pay

## 2022-09-16 DIAGNOSIS — R0982 Postnasal drip: Secondary | ICD-10-CM | POA: Diagnosis not present

## 2022-09-16 DIAGNOSIS — R6883 Chills (without fever): Secondary | ICD-10-CM | POA: Diagnosis not present

## 2022-09-16 DIAGNOSIS — Z20822 Contact with and (suspected) exposure to covid-19: Secondary | ICD-10-CM | POA: Diagnosis not present

## 2022-09-16 DIAGNOSIS — R519 Headache, unspecified: Secondary | ICD-10-CM | POA: Diagnosis not present

## 2022-09-16 DIAGNOSIS — M791 Myalgia, unspecified site: Secondary | ICD-10-CM | POA: Diagnosis not present

## 2022-10-01 DIAGNOSIS — J3089 Other allergic rhinitis: Secondary | ICD-10-CM | POA: Diagnosis not present

## 2022-10-01 DIAGNOSIS — J3081 Allergic rhinitis due to animal (cat) (dog) hair and dander: Secondary | ICD-10-CM | POA: Diagnosis not present

## 2022-10-01 DIAGNOSIS — J301 Allergic rhinitis due to pollen: Secondary | ICD-10-CM | POA: Diagnosis not present

## 2022-10-08 DIAGNOSIS — J301 Allergic rhinitis due to pollen: Secondary | ICD-10-CM | POA: Diagnosis not present

## 2022-10-08 DIAGNOSIS — J3089 Other allergic rhinitis: Secondary | ICD-10-CM | POA: Diagnosis not present

## 2022-10-08 DIAGNOSIS — J3081 Allergic rhinitis due to animal (cat) (dog) hair and dander: Secondary | ICD-10-CM | POA: Diagnosis not present

## 2022-10-15 ENCOUNTER — Other Ambulatory Visit (HOSPITAL_BASED_OUTPATIENT_CLINIC_OR_DEPARTMENT_OTHER): Payer: Self-pay

## 2022-10-15 DIAGNOSIS — J301 Allergic rhinitis due to pollen: Secondary | ICD-10-CM | POA: Diagnosis not present

## 2022-10-15 DIAGNOSIS — J3089 Other allergic rhinitis: Secondary | ICD-10-CM | POA: Diagnosis not present

## 2022-10-16 DIAGNOSIS — G4733 Obstructive sleep apnea (adult) (pediatric): Secondary | ICD-10-CM | POA: Diagnosis not present

## 2022-10-22 DIAGNOSIS — J3081 Allergic rhinitis due to animal (cat) (dog) hair and dander: Secondary | ICD-10-CM | POA: Diagnosis not present

## 2022-10-22 DIAGNOSIS — J301 Allergic rhinitis due to pollen: Secondary | ICD-10-CM | POA: Diagnosis not present

## 2022-10-22 DIAGNOSIS — J3089 Other allergic rhinitis: Secondary | ICD-10-CM | POA: Diagnosis not present

## 2022-11-05 DIAGNOSIS — J301 Allergic rhinitis due to pollen: Secondary | ICD-10-CM | POA: Diagnosis not present

## 2022-11-05 DIAGNOSIS — J3089 Other allergic rhinitis: Secondary | ICD-10-CM | POA: Diagnosis not present

## 2022-11-05 DIAGNOSIS — J3081 Allergic rhinitis due to animal (cat) (dog) hair and dander: Secondary | ICD-10-CM | POA: Diagnosis not present

## 2022-11-12 DIAGNOSIS — J3089 Other allergic rhinitis: Secondary | ICD-10-CM | POA: Diagnosis not present

## 2022-11-12 DIAGNOSIS — J301 Allergic rhinitis due to pollen: Secondary | ICD-10-CM | POA: Diagnosis not present

## 2022-11-14 ENCOUNTER — Other Ambulatory Visit (HOSPITAL_BASED_OUTPATIENT_CLINIC_OR_DEPARTMENT_OTHER): Payer: Self-pay

## 2022-11-16 DIAGNOSIS — G4733 Obstructive sleep apnea (adult) (pediatric): Secondary | ICD-10-CM | POA: Diagnosis not present

## 2022-11-26 DIAGNOSIS — J3081 Allergic rhinitis due to animal (cat) (dog) hair and dander: Secondary | ICD-10-CM | POA: Diagnosis not present

## 2022-11-26 DIAGNOSIS — J3089 Other allergic rhinitis: Secondary | ICD-10-CM | POA: Diagnosis not present

## 2022-11-26 DIAGNOSIS — J301 Allergic rhinitis due to pollen: Secondary | ICD-10-CM | POA: Diagnosis not present

## 2022-12-03 DIAGNOSIS — J301 Allergic rhinitis due to pollen: Secondary | ICD-10-CM | POA: Diagnosis not present

## 2022-12-03 DIAGNOSIS — J3089 Other allergic rhinitis: Secondary | ICD-10-CM | POA: Diagnosis not present

## 2022-12-03 DIAGNOSIS — J3081 Allergic rhinitis due to animal (cat) (dog) hair and dander: Secondary | ICD-10-CM | POA: Diagnosis not present

## 2022-12-16 ENCOUNTER — Other Ambulatory Visit (HOSPITAL_BASED_OUTPATIENT_CLINIC_OR_DEPARTMENT_OTHER): Payer: Self-pay

## 2022-12-16 DIAGNOSIS — G4733 Obstructive sleep apnea (adult) (pediatric): Secondary | ICD-10-CM | POA: Diagnosis not present

## 2022-12-17 DIAGNOSIS — J3089 Other allergic rhinitis: Secondary | ICD-10-CM | POA: Diagnosis not present

## 2022-12-17 DIAGNOSIS — J301 Allergic rhinitis due to pollen: Secondary | ICD-10-CM | POA: Diagnosis not present

## 2022-12-22 ENCOUNTER — Encounter: Payer: Self-pay | Admitting: Hematology & Oncology

## 2022-12-22 ENCOUNTER — Inpatient Hospital Stay: Payer: BC Managed Care – PPO | Attending: Hematology & Oncology

## 2022-12-22 ENCOUNTER — Inpatient Hospital Stay (HOSPITAL_BASED_OUTPATIENT_CLINIC_OR_DEPARTMENT_OTHER): Payer: BC Managed Care – PPO | Admitting: Hematology & Oncology

## 2022-12-22 ENCOUNTER — Other Ambulatory Visit: Payer: Self-pay

## 2022-12-22 ENCOUNTER — Encounter: Payer: Self-pay | Admitting: Family

## 2022-12-22 VITALS — BP 122/69 | HR 57 | Temp 97.8°F | Resp 18 | Ht 75.0 in | Wt 242.0 lb

## 2022-12-22 DIAGNOSIS — I824Y2 Acute embolism and thrombosis of unspecified deep veins of left proximal lower extremity: Secondary | ICD-10-CM

## 2022-12-22 DIAGNOSIS — Z7901 Long term (current) use of anticoagulants: Secondary | ICD-10-CM | POA: Diagnosis not present

## 2022-12-22 DIAGNOSIS — Z86718 Personal history of other venous thrombosis and embolism: Secondary | ICD-10-CM | POA: Insufficient documentation

## 2022-12-22 DIAGNOSIS — R760 Raised antibody titer: Secondary | ICD-10-CM

## 2022-12-22 DIAGNOSIS — D6862 Lupus anticoagulant syndrome: Secondary | ICD-10-CM | POA: Insufficient documentation

## 2022-12-22 LAB — CBC WITH DIFFERENTIAL (CANCER CENTER ONLY)
Abs Immature Granulocytes: 0.01 10*3/uL (ref 0.00–0.07)
Basophils Absolute: 0 10*3/uL (ref 0.0–0.1)
Basophils Relative: 1 %
Eosinophils Absolute: 0.1 10*3/uL (ref 0.0–0.5)
Eosinophils Relative: 2 %
HCT: 41 % (ref 39.0–52.0)
Hemoglobin: 13.2 g/dL (ref 13.0–17.0)
Immature Granulocytes: 0 %
Lymphocytes Relative: 41 %
Lymphs Abs: 1.4 10*3/uL (ref 0.7–4.0)
MCH: 30.2 pg (ref 26.0–34.0)
MCHC: 32.2 g/dL (ref 30.0–36.0)
MCV: 93.8 fL (ref 80.0–100.0)
Monocytes Absolute: 0.5 10*3/uL (ref 0.1–1.0)
Monocytes Relative: 15 %
Neutro Abs: 1.4 10*3/uL — ABNORMAL LOW (ref 1.7–7.7)
Neutrophils Relative %: 41 %
Platelet Count: 169 10*3/uL (ref 150–400)
RBC: 4.37 MIL/uL (ref 4.22–5.81)
RDW: 11.9 % (ref 11.5–15.5)
WBC Count: 3.3 10*3/uL — ABNORMAL LOW (ref 4.0–10.5)
nRBC: 0 % (ref 0.0–0.2)

## 2022-12-22 LAB — CMP (CANCER CENTER ONLY)
ALT: 17 U/L (ref 0–44)
AST: 23 U/L (ref 15–41)
Albumin: 4.4 g/dL (ref 3.5–5.0)
Alkaline Phosphatase: 64 U/L (ref 38–126)
Anion gap: 7 (ref 5–15)
BUN: 11 mg/dL (ref 6–20)
CO2: 28 mmol/L (ref 22–32)
Calcium: 9.4 mg/dL (ref 8.9–10.3)
Chloride: 107 mmol/L (ref 98–111)
Creatinine: 1.1 mg/dL (ref 0.61–1.24)
GFR, Estimated: >60 mL/min (ref >60)
Glucose, Bld: 103 mg/dL — ABNORMAL HIGH (ref 70–99)
Potassium: 4.1 mmol/L (ref 3.5–5.1)
Sodium: 142 mmol/L (ref 135–145)
Total Bilirubin: 0.7 mg/dL (ref <1.2)
Total Protein: 7.4 g/dL (ref 6.5–8.1)
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
## 2022-12-24 DIAGNOSIS — J301 Allergic rhinitis due to pollen: Secondary | ICD-10-CM | POA: Diagnosis not present

## 2022-12-24 DIAGNOSIS — J3089 Other allergic rhinitis: Secondary | ICD-10-CM | POA: Diagnosis not present

## 2022-12-24 LAB — LUPUS ANTICOAGULANT PANEL
DRVVT: 51.5 s — ABNORMAL HIGH (ref 0.0–47.0)
PTT Lupus Anticoagulant: 44 s — ABNORMAL HIGH (ref 0.0–43.5)

## 2022-12-24 LAB — DRVVT MIX: dRVVT Mix: 42.1 s — ABNORMAL HIGH (ref 0.0–40.4)

## 2022-12-24 LAB — PTT-LA MIX: PTT-LA Mix: 39.7 s (ref 0.0–40.5)

## 2022-12-24 LAB — DRVVT CONFIRM: dRVVT Confirm: 1.1 {ratio} (ref 0.8–1.2)

## 2022-12-24 LAB — HEXAGONAL PHASE PHOSPHOLIPID: Hexagonal Phase Phospholipid: 8 s (ref 0–11)

## 2022-12-24 LAB — PTT-LA INCUB MIX: PTT-LA Incub Mix: 43.5 s — ABNORMAL HIGH (ref 0.0–40.5)

## 2022-12-30 DIAGNOSIS — J301 Allergic rhinitis due to pollen: Secondary | ICD-10-CM | POA: Diagnosis not present

## 2022-12-31 DIAGNOSIS — J301 Allergic rhinitis due to pollen: Secondary | ICD-10-CM | POA: Diagnosis not present

## 2022-12-31 DIAGNOSIS — J3089 Other allergic rhinitis: Secondary | ICD-10-CM | POA: Diagnosis not present

## 2023-01-14 ENCOUNTER — Other Ambulatory Visit (HOSPITAL_BASED_OUTPATIENT_CLINIC_OR_DEPARTMENT_OTHER): Payer: Self-pay

## 2023-01-21 DIAGNOSIS — J301 Allergic rhinitis due to pollen: Secondary | ICD-10-CM | POA: Diagnosis not present

## 2023-01-21 DIAGNOSIS — J3089 Other allergic rhinitis: Secondary | ICD-10-CM | POA: Diagnosis not present

## 2023-01-28 DIAGNOSIS — J301 Allergic rhinitis due to pollen: Secondary | ICD-10-CM | POA: Diagnosis not present

## 2023-01-28 DIAGNOSIS — J3089 Other allergic rhinitis: Secondary | ICD-10-CM | POA: Diagnosis not present

## 2023-02-04 DIAGNOSIS — J3089 Other allergic rhinitis: Secondary | ICD-10-CM | POA: Diagnosis not present

## 2023-02-04 DIAGNOSIS — J301 Allergic rhinitis due to pollen: Secondary | ICD-10-CM | POA: Diagnosis not present

## 2023-02-13 ENCOUNTER — Other Ambulatory Visit (HOSPITAL_BASED_OUTPATIENT_CLINIC_OR_DEPARTMENT_OTHER): Payer: Self-pay

## 2023-02-13 ENCOUNTER — Other Ambulatory Visit: Payer: Self-pay | Admitting: Hematology & Oncology

## 2023-02-13 ENCOUNTER — Encounter (HOSPITAL_BASED_OUTPATIENT_CLINIC_OR_DEPARTMENT_OTHER): Payer: Self-pay

## 2023-02-13 DIAGNOSIS — R760 Raised antibody titer: Secondary | ICD-10-CM

## 2023-02-13 DIAGNOSIS — I824Y2 Acute embolism and thrombosis of unspecified deep veins of left proximal lower extremity: Secondary | ICD-10-CM

## 2023-02-13 MED ORDER — APIXABAN 2.5 MG PO TABS
2.5000 mg | ORAL_TABLET | Freq: Two times a day (BID) | ORAL | 5 refills | Status: DC
  Filled 2023-02-13: qty 60, 30d supply, fill #0
  Filled 2023-03-16: qty 60, 30d supply, fill #1
  Filled 2023-04-10: qty 60, 30d supply, fill #2
  Filled 2023-05-08: qty 60, 30d supply, fill #3
  Filled 2023-06-10: qty 60, 30d supply, fill #4

## 2023-02-16 ENCOUNTER — Other Ambulatory Visit (HOSPITAL_COMMUNITY): Payer: Self-pay

## 2023-02-16 ENCOUNTER — Other Ambulatory Visit (HOSPITAL_BASED_OUTPATIENT_CLINIC_OR_DEPARTMENT_OTHER): Payer: Self-pay

## 2023-02-17 ENCOUNTER — Other Ambulatory Visit (HOSPITAL_BASED_OUTPATIENT_CLINIC_OR_DEPARTMENT_OTHER): Payer: Self-pay

## 2023-02-25 DIAGNOSIS — J3089 Other allergic rhinitis: Secondary | ICD-10-CM | POA: Diagnosis not present

## 2023-02-25 DIAGNOSIS — J301 Allergic rhinitis due to pollen: Secondary | ICD-10-CM | POA: Diagnosis not present

## 2023-03-02 ENCOUNTER — Other Ambulatory Visit: Payer: Self-pay | Admitting: Family

## 2023-03-02 ENCOUNTER — Other Ambulatory Visit: Payer: Self-pay | Admitting: Internal Medicine

## 2023-03-04 DIAGNOSIS — J3089 Other allergic rhinitis: Secondary | ICD-10-CM | POA: Diagnosis not present

## 2023-03-04 DIAGNOSIS — J301 Allergic rhinitis due to pollen: Secondary | ICD-10-CM | POA: Diagnosis not present

## 2023-03-04 DIAGNOSIS — J3081 Allergic rhinitis due to animal (cat) (dog) hair and dander: Secondary | ICD-10-CM | POA: Diagnosis not present

## 2023-03-05 ENCOUNTER — Other Ambulatory Visit: Payer: Self-pay

## 2023-03-05 MED ORDER — PANTOPRAZOLE SODIUM 40 MG PO TBEC: 40.0000 mg | DELAYED_RELEASE_TABLET | Freq: Every day | ORAL | 1 refills | Status: AC

## 2023-03-11 DIAGNOSIS — J3089 Other allergic rhinitis: Secondary | ICD-10-CM | POA: Diagnosis not present

## 2023-03-11 DIAGNOSIS — J301 Allergic rhinitis due to pollen: Secondary | ICD-10-CM | POA: Diagnosis not present

## 2023-03-11 DIAGNOSIS — J3081 Allergic rhinitis due to animal (cat) (dog) hair and dander: Secondary | ICD-10-CM | POA: Diagnosis not present

## 2023-03-16 ENCOUNTER — Other Ambulatory Visit (HOSPITAL_BASED_OUTPATIENT_CLINIC_OR_DEPARTMENT_OTHER): Payer: Self-pay

## 2023-03-18 DIAGNOSIS — J3089 Other allergic rhinitis: Secondary | ICD-10-CM | POA: Diagnosis not present

## 2023-03-18 DIAGNOSIS — J301 Allergic rhinitis due to pollen: Secondary | ICD-10-CM | POA: Diagnosis not present

## 2023-03-24 DIAGNOSIS — J3081 Allergic rhinitis due to animal (cat) (dog) hair and dander: Secondary | ICD-10-CM | POA: Diagnosis not present

## 2023-03-24 DIAGNOSIS — J301 Allergic rhinitis due to pollen: Secondary | ICD-10-CM | POA: Diagnosis not present

## 2023-03-24 DIAGNOSIS — J3089 Other allergic rhinitis: Secondary | ICD-10-CM | POA: Diagnosis not present

## 2023-04-01 DIAGNOSIS — G4733 Obstructive sleep apnea (adult) (pediatric): Secondary | ICD-10-CM | POA: Diagnosis not present

## 2023-04-10 ENCOUNTER — Other Ambulatory Visit (HOSPITAL_BASED_OUTPATIENT_CLINIC_OR_DEPARTMENT_OTHER): Payer: Self-pay

## 2023-04-13 DIAGNOSIS — H5213 Myopia, bilateral: Secondary | ICD-10-CM | POA: Diagnosis not present

## 2023-04-13 DIAGNOSIS — H52223 Regular astigmatism, bilateral: Secondary | ICD-10-CM | POA: Diagnosis not present

## 2023-04-13 DIAGNOSIS — H11153 Pinguecula, bilateral: Secondary | ICD-10-CM | POA: Diagnosis not present

## 2023-04-13 DIAGNOSIS — H524 Presbyopia: Secondary | ICD-10-CM | POA: Diagnosis not present

## 2023-04-13 DIAGNOSIS — H2513 Age-related nuclear cataract, bilateral: Secondary | ICD-10-CM | POA: Diagnosis not present

## 2023-04-15 DIAGNOSIS — J3089 Other allergic rhinitis: Secondary | ICD-10-CM | POA: Diagnosis not present

## 2023-04-15 DIAGNOSIS — J301 Allergic rhinitis due to pollen: Secondary | ICD-10-CM | POA: Diagnosis not present

## 2023-04-22 DIAGNOSIS — J3081 Allergic rhinitis due to animal (cat) (dog) hair and dander: Secondary | ICD-10-CM | POA: Diagnosis not present

## 2023-04-22 DIAGNOSIS — J301 Allergic rhinitis due to pollen: Secondary | ICD-10-CM | POA: Diagnosis not present

## 2023-04-22 DIAGNOSIS — J3089 Other allergic rhinitis: Secondary | ICD-10-CM | POA: Diagnosis not present

## 2023-04-29 DIAGNOSIS — J301 Allergic rhinitis due to pollen: Secondary | ICD-10-CM | POA: Diagnosis not present

## 2023-04-29 DIAGNOSIS — J3089 Other allergic rhinitis: Secondary | ICD-10-CM | POA: Diagnosis not present

## 2023-04-29 DIAGNOSIS — J3081 Allergic rhinitis due to animal (cat) (dog) hair and dander: Secondary | ICD-10-CM | POA: Diagnosis not present

## 2023-04-29 DIAGNOSIS — G4733 Obstructive sleep apnea (adult) (pediatric): Secondary | ICD-10-CM | POA: Diagnosis not present

## 2023-05-06 DIAGNOSIS — J301 Allergic rhinitis due to pollen: Secondary | ICD-10-CM | POA: Diagnosis not present

## 2023-05-06 DIAGNOSIS — J3089 Other allergic rhinitis: Secondary | ICD-10-CM | POA: Diagnosis not present

## 2023-05-08 ENCOUNTER — Other Ambulatory Visit (HOSPITAL_BASED_OUTPATIENT_CLINIC_OR_DEPARTMENT_OTHER): Payer: Self-pay

## 2023-05-19 DIAGNOSIS — R350 Frequency of micturition: Secondary | ICD-10-CM | POA: Diagnosis not present

## 2023-05-19 DIAGNOSIS — N401 Enlarged prostate with lower urinary tract symptoms: Secondary | ICD-10-CM | POA: Diagnosis not present

## 2023-05-20 DIAGNOSIS — J301 Allergic rhinitis due to pollen: Secondary | ICD-10-CM | POA: Diagnosis not present

## 2023-05-20 DIAGNOSIS — J3081 Allergic rhinitis due to animal (cat) (dog) hair and dander: Secondary | ICD-10-CM | POA: Diagnosis not present

## 2023-05-20 DIAGNOSIS — J3089 Other allergic rhinitis: Secondary | ICD-10-CM | POA: Diagnosis not present

## 2023-05-26 DIAGNOSIS — N401 Enlarged prostate with lower urinary tract symptoms: Secondary | ICD-10-CM | POA: Diagnosis not present

## 2023-05-26 DIAGNOSIS — R350 Frequency of micturition: Secondary | ICD-10-CM | POA: Diagnosis not present

## 2023-05-30 DIAGNOSIS — G4733 Obstructive sleep apnea (adult) (pediatric): Secondary | ICD-10-CM | POA: Diagnosis not present

## 2023-06-03 DIAGNOSIS — J3089 Other allergic rhinitis: Secondary | ICD-10-CM | POA: Diagnosis not present

## 2023-06-03 DIAGNOSIS — J301 Allergic rhinitis due to pollen: Secondary | ICD-10-CM | POA: Diagnosis not present

## 2023-06-10 ENCOUNTER — Other Ambulatory Visit (HOSPITAL_BASED_OUTPATIENT_CLINIC_OR_DEPARTMENT_OTHER): Payer: Self-pay

## 2023-06-19 ENCOUNTER — Telehealth: Admitting: Family Medicine

## 2023-06-19 DIAGNOSIS — J309 Allergic rhinitis, unspecified: Secondary | ICD-10-CM

## 2023-06-19 MED ORDER — FLUTICASONE PROPIONATE 50 MCG/ACT NA SUSP: 2.0000 | Freq: Every day | NASAL | 6 refills | Status: AC

## 2023-06-19 MED ORDER — PREDNISONE 20 MG PO TABS: 20.0000 mg | ORAL_TABLET | Freq: Two times a day (BID) | ORAL | 0 refills | Status: AC

## 2023-06-19 NOTE — Progress Notes (Signed)
 E visit for Allergic Rhinitis We are sorry that you are not feeling well.  Here is how we plan to help!  Based on what you have shared with me it looks like you have Allergic Rhinitis.  Rhinitis is when a reaction occurs that causes nasal congestion, runny nose, sneezing, and itching.  Most types of rhinitis are caused by an inflammation and are associated with symptoms in the eyes ears or throat. There are several types of rhinitis.  The most common are acute rhinitis, which is usually caused by a viral illness, allergic or seasonal rhinitis, and nonallergic or year-round rhinitis.  Nasal allergies occur certain times of the year.  Allergic rhinitis is caused when allergens in the air trigger the release of histamine in the body.  Histamine causes itching, swelling, and fluid to build up in the fragile linings of the nasal passages, sinuses and eyelids.  An itchy nose and clear discharge are common.  I recommend the following over the counter treatments: You should take a daily dose of antihistamine  I also would recommend a nasal spray: Flonase  2 sprays into each nostril once daily  You may also benefit from eye drops such as:  I will also send prednisone .   HOME CARE:  You can use an over-the-counter saline nasal spray as needed Avoid areas where there is heavy dust, mites, or molds Stay indoors on windy days during the pollen season Keep windows closed in home, at least in bedroom; use air conditioner. Use high-efficiency house air filter Keep windows closed in car, turn AC on re-circulate Avoid playing out with dog during pollen season  GET HELP RIGHT AWAY IF:  If your symptoms do not improve within 10 days You become short of breath You develop yellow or green discharge from your nose for over 3 days You have coughing fits  MAKE SURE YOU:  Understand these instructions Will watch your condition Will get help right away if you are not doing well or get worse  Thank you for  choosing an e-visit. Your e-visit answers were reviewed by a board certified advanced clinical practitioner to complete your personal care plan. Depending upon the condition, your plan could have included both over the counter or prescription medications. Please review your pharmacy choice. Be sure that the pharmacy you have chosen is open so that you can pick up your prescription now.  If there is a problem you may message your provider in MyChart to have the prescription routed to another pharmacy. Your safety is important to us . If you have drug allergies check your prescription carefully.  For the next 24 hours, you can use MyChart to ask questions about today's visit, request a non-urgent call back, or ask for a work or school excuse from your e-visit provider. You will get an email in the next two days asking about your experience. I hope that your e-visit has been valuable and will speed your recovery.    have provided 5 minutes of non face to face time during this encounter for chart review and documentation.

## 2023-06-20 DIAGNOSIS — R051 Acute cough: Secondary | ICD-10-CM | POA: Diagnosis not present

## 2023-06-20 DIAGNOSIS — J019 Acute sinusitis, unspecified: Secondary | ICD-10-CM | POA: Diagnosis not present

## 2023-06-22 ENCOUNTER — Other Ambulatory Visit: Payer: Self-pay

## 2023-06-22 ENCOUNTER — Encounter: Payer: Self-pay | Admitting: Hematology & Oncology

## 2023-06-22 ENCOUNTER — Inpatient Hospital Stay (HOSPITAL_BASED_OUTPATIENT_CLINIC_OR_DEPARTMENT_OTHER): Payer: BC Managed Care – PPO | Admitting: Hematology & Oncology

## 2023-06-22 ENCOUNTER — Inpatient Hospital Stay: Payer: BC Managed Care – PPO | Attending: Hematology & Oncology

## 2023-06-22 ENCOUNTER — Other Ambulatory Visit (HOSPITAL_BASED_OUTPATIENT_CLINIC_OR_DEPARTMENT_OTHER): Payer: Self-pay

## 2023-06-22 VITALS — BP 113/75 | HR 61 | Temp 97.6°F | Resp 16 | Ht 75.0 in | Wt 235.0 lb

## 2023-06-22 DIAGNOSIS — D6862 Lupus anticoagulant syndrome: Secondary | ICD-10-CM | POA: Diagnosis not present

## 2023-06-22 DIAGNOSIS — Z7901 Long term (current) use of anticoagulants: Secondary | ICD-10-CM | POA: Insufficient documentation

## 2023-06-22 DIAGNOSIS — I824Y2 Acute embolism and thrombosis of unspecified deep veins of left proximal lower extremity: Secondary | ICD-10-CM | POA: Diagnosis not present

## 2023-06-22 DIAGNOSIS — Z86718 Personal history of other venous thrombosis and embolism: Secondary | ICD-10-CM | POA: Insufficient documentation

## 2023-06-22 DIAGNOSIS — R760 Raised antibody titer: Secondary | ICD-10-CM | POA: Diagnosis not present

## 2023-06-22 LAB — CMP (CANCER CENTER ONLY)
ALT: 15 U/L (ref 0–44)
AST: 16 U/L (ref 15–41)
Albumin: 4.4 g/dL (ref 3.5–5.0)
Alkaline Phosphatase: 58 U/L (ref 38–126)
Anion gap: 10 (ref 5–15)
BUN: 15 mg/dL (ref 6–20)
CO2: 26 mmol/L (ref 22–32)
Calcium: 9.5 mg/dL (ref 8.9–10.3)
Chloride: 103 mmol/L (ref 98–111)
Creatinine: 0.92 mg/dL (ref 0.61–1.24)
GFR, Estimated: >60 mL/min (ref >60)
Glucose, Bld: 117 mg/dL — ABNORMAL HIGH (ref 70–99)
Potassium: 4.1 mmol/L (ref 3.5–5.1)
Sodium: 139 mmol/L (ref 135–145)
Total Bilirubin: 0.8 mg/dL (ref 0.0–1.2)
Total Protein: 7.9 g/dL (ref 6.5–8.1)

## 2023-06-22 LAB — CBC WITH DIFFERENTIAL (CANCER CENTER ONLY)
Abs Immature Granulocytes: 0.06 10*3/uL (ref 0.00–0.07)
Basophils Absolute: 0 10*3/uL (ref 0.0–0.1)
Basophils Relative: 0 %
Eosinophils Absolute: 0 10*3/uL (ref 0.0–0.5)
Eosinophils Relative: 0 %
HCT: 39.1 % (ref 39.0–52.0)
Hemoglobin: 12.8 g/dL — ABNORMAL LOW (ref 13.0–17.0)
Immature Granulocytes: 1 %
Lymphocytes Relative: 13 %
Lymphs Abs: 1.1 10*3/uL (ref 0.7–4.0)
MCH: 30.2 pg (ref 26.0–34.0)
MCHC: 32.7 g/dL (ref 30.0–36.0)
MCV: 92.2 fL (ref 80.0–100.0)
Monocytes Absolute: 0.7 10*3/uL (ref 0.1–1.0)
Monocytes Relative: 8 %
Neutro Abs: 7 10*3/uL (ref 1.7–7.7)
Neutrophils Relative %: 78 %
Platelet Count: 220 10*3/uL (ref 150–400)
RBC: 4.24 MIL/uL (ref 4.22–5.81)
RDW: 11.8 % (ref 11.5–15.5)
WBC Count: 8.8 10*3/uL (ref 4.0–10.5)
nRBC: 0 % (ref 0.0–0.2)

## 2023-06-22 MED ORDER — APIXABAN 2.5 MG PO TABS
2.5000 mg | ORAL_TABLET | Freq: Two times a day (BID) | ORAL | 12 refills | Status: DC
  Filled 2023-06-22 – 2023-07-02 (×6): qty 60, 30d supply, fill #0
  Filled ????-??-??: fill #0
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
## 2023-06-23 LAB — DRVVT CONFIRM: dRVVT Confirm: 1.1 ratio (ref 0.8–1.2)

## 2023-06-23 LAB — LUPUS ANTICOAGULANT PANEL
DRVVT: 52.3 s — ABNORMAL HIGH (ref 0.0–47.0)
PTT Lupus Anticoagulant: 40.5 s (ref 0.0–43.5)

## 2023-06-23 LAB — DRVVT MIX: dRVVT Mix: 44 s — ABNORMAL HIGH (ref 0.0–40.4)

## 2023-06-24 LAB — CARDIOLIPIN ANTIBODIES, IGG, IGM, IGA
Anticardiolipin IgA: <9 U/mL (ref 0–11)
Anticardiolipin IgG: 19 GPL U/mL — ABNORMAL HIGH (ref 0–14)
Anticardiolipin IgM: 26 [MPL'U]/mL — ABNORMAL HIGH (ref 0–12)

## 2023-07-01 ENCOUNTER — Other Ambulatory Visit (HOSPITAL_COMMUNITY): Payer: Self-pay

## 2023-07-01 ENCOUNTER — Other Ambulatory Visit (HOSPITAL_BASED_OUTPATIENT_CLINIC_OR_DEPARTMENT_OTHER): Payer: Self-pay

## 2023-07-02 ENCOUNTER — Other Ambulatory Visit: Payer: Self-pay | Admitting: *Deleted

## 2023-07-02 ENCOUNTER — Other Ambulatory Visit: Payer: Self-pay

## 2023-07-02 ENCOUNTER — Other Ambulatory Visit (HOSPITAL_COMMUNITY): Payer: Self-pay

## 2023-07-02 ENCOUNTER — Telehealth: Payer: Self-pay | Admitting: *Deleted

## 2023-07-02 ENCOUNTER — Other Ambulatory Visit (HOSPITAL_BASED_OUTPATIENT_CLINIC_OR_DEPARTMENT_OTHER): Payer: Self-pay

## 2023-07-02 DIAGNOSIS — I824Y2 Acute embolism and thrombosis of unspecified deep veins of left proximal lower extremity: Secondary | ICD-10-CM

## 2023-07-02 DIAGNOSIS — R760 Raised antibody titer: Secondary | ICD-10-CM

## 2023-07-02 MED ORDER — APIXABAN 2.5 MG PO TABS
2.5000 mg | ORAL_TABLET | Freq: Two times a day (BID) | ORAL | 3 refills | Status: AC
Start: 2023-07-02 — End: ?

## 2023-07-02 NOTE — Telephone Encounter (Signed)
 Message received from patient stating that he needs his Eliquis  refill to be changed to the [facility] on [address] and is requesting that a 90 day supply be sent in.  Eliquis  prescription sent per pt.'s request and pt notified.

## 2023-07-14 ENCOUNTER — Encounter: Payer: Self-pay | Admitting: Internal Medicine

## 2023-07-14 ENCOUNTER — Telehealth: Payer: Self-pay

## 2023-07-14 ENCOUNTER — Ambulatory Visit (INDEPENDENT_AMBULATORY_CARE_PROVIDER_SITE_OTHER): Payer: BC Managed Care – PPO | Admitting: Internal Medicine

## 2023-07-14 ENCOUNTER — Other Ambulatory Visit (HOSPITAL_COMMUNITY): Payer: Self-pay

## 2023-07-14 VITALS — BP 118/72 | HR 63 | Temp 98.4°F | Resp 16 | Ht 75.0 in | Wt 238.1 lb

## 2023-07-14 DIAGNOSIS — R739 Hyperglycemia, unspecified: Secondary | ICD-10-CM | POA: Diagnosis not present

## 2023-07-14 DIAGNOSIS — Z0000 Encounter for general adult medical examination without abnormal findings: Secondary | ICD-10-CM | POA: Diagnosis not present

## 2023-07-14 DIAGNOSIS — Z23 Encounter for immunization: Secondary | ICD-10-CM

## 2023-07-14 LAB — LIPID PANEL
Cholesterol: 163 mg/dL (ref 0–200)
HDL: 41.9 mg/dL (ref >39.00)
LDL Cholesterol: 103 mg/dL — ABNORMAL HIGH (ref 0–99)
NonHDL: 121.42
Total CHOL/HDL Ratio: 4
Triglycerides: 94 mg/dL (ref 0.0–149.0)
VLDL: 18.8 mg/dL (ref 0.0–40.0)

## 2023-07-14 LAB — HEMOGLOBIN A1C: Hgb A1c MFr Bld: 5.1 % (ref 4.6–6.5)

## 2023-07-14 LAB — TSH: TSH: 2.23 u[IU]/mL (ref 0.35–5.50)

## 2023-07-14 MED ORDER — ZEPBOUND 2.5 MG/0.5ML ~~LOC~~ SOAJ: 2.5000 mg | SUBCUTANEOUS | 0 refills | Status: DC

## 2023-07-14 NOTE — Assessment & Plan Note (Signed)
 Here for CPX   Other issues addressed today: OSA, BMI 29: Unable to lose weight despite eating healthy and exercise regularly.  GLP-1's?  Side effects, cost and insurance issues discussed, will prescribe Zepbound. If he ended up taking it, recommend to RTC in 6 weeks. Lupus anticoagulant, history of DVTs LOV hematology 06/22/2023, was rec to continue Eliquis , most likely long-term Palpitations: Asymptomatic, on beta-blockers. OSA: Good compliance with CPAP. RTC 1 year

## 2023-07-14 NOTE — Assessment & Plan Note (Signed)
 Here for CPX -Td today 06/2023 - Shingrix No. 1 07/14/2023, next in 2 to 3 months -Vaccines I recommend:  flu shot every fall --CCS: +FH Cscope 10-08 per pt (2 polyps);cscope 11-2011;cscope 03/2017 , neg. C-scope 09/08/2022.  Next per GI --see urology, PSA 0.3 (05/19/2023) [facility] -Labs: FLP, A1c, TSH -- Diet-exercise: Unable to lose weight lately despite exercising and trying to eat healthy (except for the last 10 days, he was on vacation).  Recommend to continue his efforts to have healthy lifestyle.

## 2023-07-14 NOTE — Progress Notes (Signed)
 Subjective:    Patient ID: Ronnie [name], male    DOB: [birth year], 63 y.o.   MRN: [id number]  DOS:  07/14/2023 Type of visit - description: CPX  Here for CPX. In general feels well. He is somewhat frustrated about not being able to lose weight despite trying to exercise and eat healthy  Review of Systems  Other than above, a 14 point review of systems is negative    Past Medical History:  Diagnosis Date   Allergic rhinitis    Anxiety    DVT (deep venous thrombosis) (HCC)    RLE 2002 aprox, (-) anticoag panel   GERD (gastroesophageal reflux disease)    OSA on CPAP    Palpitations    on betablokers    Past Surgical History:  Procedure Laterality Date   NASAL SEPTOPLASTY W/ TURBINOPLASTY     TONSILLECTOMY     Social History   Socioeconomic History   Marital status: Married    Spouse name: Not on file   Number of children: 1   Years of education: Not on file   Highest education level: Not on file  Occupational History   Occupation: consulting work  Tobacco Use   Smoking status: Never   Smokeless tobacco: Never  Vaping Use   Vaping status: Never Used  Substance and Sexual Activity   Alcohol use: Yes    Comment: rare   Drug use: No   Sexual activity: Not on file  Other Topics Concern   Not on file  Social History Narrative   Lives w/ wife   Child born 2000, gone to college    Lost father 01-2014            Social Drivers of Health   Financial Resource Strain: Not on file  Food Insecurity: Not on file  Transportation Needs: Not on file  Physical Activity: Not on file  Stress: Not on file  Social Connections: Not on file  Intimate Partner Violence: Not on file    Current Outpatient Medications  Medication Instructions   apixaban  (ELIQUIS ) 2.5 mg, Oral, 2 times daily   cetirizine (ZYRTEC) 10 MG tablet Daily   EPINEPHrine 0.3 mg/0.3 mL IJ SOAJ injection See admin instructions   fluticasone  (FLONASE ) 50 MCG/ACT nasal spray 2 sprays, Each Nare,  Daily   metoprolol  succinate (TOPROL -XL) 25 mg, Oral, Daily PRN   Multiple Vitamin (MULTIVITAMIN) capsule 1 capsule, Daily   NON FORMULARY 1 Dose, Weekly   pantoprazole  (PROTONIX ) 40 mg, Oral, Daily before breakfast   Zepbound  2.5 mg, Subcutaneous, Weekly       Objective:   Physical Exam BP 118/72   Pulse 63   Temp 98.4 F (36.9 C) (Oral)   Resp 16   Ht 6\' 3"  (1.905 m)   Wt 238 lb 2 oz (108 kg)   SpO2 97%   BMI 29.76 kg/m  General: Well developed, NAD, BMI noted Neck: No  thyromegaly  HEENT:  Normocephalic . Face symmetric, atraumatic Lungs:  CTA B Normal respiratory effort, no intercostal retractions, no accessory muscle use. Heart: RRR,  no murmur.  Abdomen:  Not distended, soft, non-tender. No rebound or rigidity.   Lower extremities: no pretibial edema bilaterally  Skin: Exposed areas without rash. Not pale. Not jaundice Neurologic:  alert & oriented X3.  Speech normal, gait appropriate for age and unassisted Strength symmetric and appropriate for age.  Psych: Cognition and judgment appear intact.  Cooperative with normal attention span and concentration.  Behavior appropriate. No  anxious or depressed appearing.     Assessment    Assessment Palpitations on bb qd OSA, on CPAP GERD Allergic rhinitis, weekly shots, Dr Almeda Jacobs DVT, RLE 2002 , (-)  coagulation panel; DVT #2 2022 -- (+) lupus anticoagulant H/o anxiety  PLAN: Here for CPX -Td today 06/2023 - Shingrix No. 1 07/14/2023, next in 2 to 3 months -Vaccines I recommend:  flu shot every fall --CCS: +FH Cscope 10-08 per pt (2 polyps);cscope 11-2011;cscope 03/2017 , neg. C-scope 09/08/2022.  Next per GI --see urology, PSA 0.3 (05/19/2023) [facility] -Labs: FLP, A1c, TSH -- Diet-exercise: Unable to lose weight lately despite exercising and trying to eat healthy (except for the last 10 days, he was on vacation).  Recommend to continue his efforts to have healthy lifestyle.  Other issues addressed today: OSA, BMI  29: Unable to lose weight despite eating healthy and exercise regularly.  GLP-1's?  Side effects, cost and insurance issues discussed, will prescribe Zepbound. If he ended up taking it, recommend to RTC in 6 weeks. Lupus anticoagulant, history of DVTs LOV hematology 06/22/2023, was rec to continue Eliquis , most likely long-term Palpitations: Asymptomatic, on beta-blockers. OSA: Good compliance with CPAP. RTC 1 year

## 2023-07-14 NOTE — Patient Instructions (Signed)
 Vaccines I recommend: Flu shot this fall  Continue eating healthy, exercise 3 hours weekly.  Will send a prescription for Zepbound . If you start the medication, come back in 6 weeks  GO TO THE LAB :  Get the blood work   Your results will be posted on MyChart with my comments  Next office visit for a physical exam in 1 year Please make an appointment before you leave today

## 2023-07-14 NOTE — Telephone Encounter (Signed)
 Pharmacy Patient Advocate Encounter   Received notification from CoverMyMeds that prior authorization for Zepbound 2.5 is required/requested.   Insurance verification completed.   The patient is insured through [employer] .   Per test claim: PA required; PA submitted to above mentioned insurance via CoverMyMeds Key/confirmation #/EOC [id number] Status is pending

## 2023-07-15 ENCOUNTER — Ambulatory Visit: Payer: Self-pay | Admitting: Internal Medicine

## 2023-07-15 NOTE — Telephone Encounter (Signed)
 Insurance will Pt a letter w/ this information as well.

## 2023-07-15 NOTE — Telephone Encounter (Signed)
 Pharmacy Patient Advocate Encounter  Received notification from [employer] that Prior Authorization for Zepbound 2.5 has been DENIED.  Full denial letter will be uploaded to the media tab. See denial reason below.   PA #/Case ID/Reference #: [id number]

## 2023-07-15 NOTE — Telephone Encounter (Signed)
 FYI. Zepbound denied, insurance requires- 2 co-morbidities, per chart he only has OSA.

## 2023-07-15 NOTE — Telephone Encounter (Signed)
 Send the patient a message stating his insurance decision

## 2023-07-16 ENCOUNTER — Encounter: Payer: Self-pay | Admitting: Internal Medicine

## 2023-07-20 DIAGNOSIS — J301 Allergic rhinitis due to pollen: Secondary | ICD-10-CM | POA: Diagnosis not present

## 2023-07-20 DIAGNOSIS — H1045 Other chronic allergic conjunctivitis: Secondary | ICD-10-CM | POA: Diagnosis not present

## 2023-07-20 DIAGNOSIS — J3089 Other allergic rhinitis: Secondary | ICD-10-CM | POA: Diagnosis not present

## 2023-07-22 DIAGNOSIS — J3089 Other allergic rhinitis: Secondary | ICD-10-CM | POA: Diagnosis not present

## 2023-07-22 DIAGNOSIS — J3081 Allergic rhinitis due to animal (cat) (dog) hair and dander: Secondary | ICD-10-CM | POA: Diagnosis not present

## 2023-07-22 DIAGNOSIS — J301 Allergic rhinitis due to pollen: Secondary | ICD-10-CM | POA: Diagnosis not present

## 2023-07-29 DIAGNOSIS — J3089 Other allergic rhinitis: Secondary | ICD-10-CM | POA: Diagnosis not present

## 2023-07-29 DIAGNOSIS — J3081 Allergic rhinitis due to animal (cat) (dog) hair and dander: Secondary | ICD-10-CM | POA: Diagnosis not present

## 2023-07-29 DIAGNOSIS — J301 Allergic rhinitis due to pollen: Secondary | ICD-10-CM | POA: Diagnosis not present

## 2023-08-05 DIAGNOSIS — J3081 Allergic rhinitis due to animal (cat) (dog) hair and dander: Secondary | ICD-10-CM | POA: Diagnosis not present

## 2023-08-05 DIAGNOSIS — J301 Allergic rhinitis due to pollen: Secondary | ICD-10-CM | POA: Diagnosis not present

## 2023-08-05 DIAGNOSIS — J3089 Other allergic rhinitis: Secondary | ICD-10-CM | POA: Diagnosis not present

## 2023-08-12 DIAGNOSIS — J3089 Other allergic rhinitis: Secondary | ICD-10-CM | POA: Diagnosis not present

## 2023-08-12 DIAGNOSIS — J3081 Allergic rhinitis due to animal (cat) (dog) hair and dander: Secondary | ICD-10-CM | POA: Diagnosis not present

## 2023-08-12 DIAGNOSIS — J301 Allergic rhinitis due to pollen: Secondary | ICD-10-CM | POA: Diagnosis not present

## 2023-08-26 DIAGNOSIS — J3089 Other allergic rhinitis: Secondary | ICD-10-CM | POA: Diagnosis not present

## 2023-08-26 DIAGNOSIS — J3081 Allergic rhinitis due to animal (cat) (dog) hair and dander: Secondary | ICD-10-CM | POA: Diagnosis not present

## 2023-08-26 DIAGNOSIS — J301 Allergic rhinitis due to pollen: Secondary | ICD-10-CM | POA: Diagnosis not present

## 2023-09-02 DIAGNOSIS — J301 Allergic rhinitis due to pollen: Secondary | ICD-10-CM | POA: Diagnosis not present

## 2023-09-02 DIAGNOSIS — J3089 Other allergic rhinitis: Secondary | ICD-10-CM | POA: Diagnosis not present

## 2023-09-09 DIAGNOSIS — J3089 Other allergic rhinitis: Secondary | ICD-10-CM | POA: Diagnosis not present

## 2023-09-09 DIAGNOSIS — J3081 Allergic rhinitis due to animal (cat) (dog) hair and dander: Secondary | ICD-10-CM | POA: Diagnosis not present

## 2023-09-09 DIAGNOSIS — J301 Allergic rhinitis due to pollen: Secondary | ICD-10-CM | POA: Diagnosis not present

## 2023-09-16 DIAGNOSIS — J3089 Other allergic rhinitis: Secondary | ICD-10-CM | POA: Diagnosis not present

## 2023-09-16 DIAGNOSIS — J3081 Allergic rhinitis due to animal (cat) (dog) hair and dander: Secondary | ICD-10-CM | POA: Diagnosis not present

## 2023-09-16 DIAGNOSIS — J301 Allergic rhinitis due to pollen: Secondary | ICD-10-CM | POA: Diagnosis not present

## 2023-09-30 DIAGNOSIS — J301 Allergic rhinitis due to pollen: Secondary | ICD-10-CM | POA: Diagnosis not present

## 2023-09-30 DIAGNOSIS — J3089 Other allergic rhinitis: Secondary | ICD-10-CM | POA: Diagnosis not present

## 2023-09-30 DIAGNOSIS — J3081 Allergic rhinitis due to animal (cat) (dog) hair and dander: Secondary | ICD-10-CM | POA: Diagnosis not present

## 2023-10-14 ENCOUNTER — Ambulatory Visit (INDEPENDENT_AMBULATORY_CARE_PROVIDER_SITE_OTHER)

## 2023-10-14 DIAGNOSIS — Z23 Encounter for immunization: Secondary | ICD-10-CM | POA: Diagnosis not present

## 2023-10-14 NOTE — Progress Notes (Signed)
 Patient here today for second shingles immunization. Tolerated well

## 2023-10-21 DIAGNOSIS — J301 Allergic rhinitis due to pollen: Secondary | ICD-10-CM | POA: Diagnosis not present

## 2023-10-21 DIAGNOSIS — J3089 Other allergic rhinitis: Secondary | ICD-10-CM | POA: Diagnosis not present

## 2023-10-21 DIAGNOSIS — J3081 Allergic rhinitis due to animal (cat) (dog) hair and dander: Secondary | ICD-10-CM | POA: Diagnosis not present

## 2023-10-28 DIAGNOSIS — J3081 Allergic rhinitis due to animal (cat) (dog) hair and dander: Secondary | ICD-10-CM | POA: Diagnosis not present

## 2023-10-28 DIAGNOSIS — J301 Allergic rhinitis due to pollen: Secondary | ICD-10-CM | POA: Diagnosis not present

## 2023-10-28 DIAGNOSIS — J3089 Other allergic rhinitis: Secondary | ICD-10-CM | POA: Diagnosis not present

## 2023-11-04 DIAGNOSIS — J3081 Allergic rhinitis due to animal (cat) (dog) hair and dander: Secondary | ICD-10-CM | POA: Diagnosis not present

## 2023-11-04 DIAGNOSIS — J3089 Other allergic rhinitis: Secondary | ICD-10-CM | POA: Diagnosis not present

## 2023-11-04 DIAGNOSIS — J301 Allergic rhinitis due to pollen: Secondary | ICD-10-CM | POA: Diagnosis not present

## 2023-11-11 DIAGNOSIS — J301 Allergic rhinitis due to pollen: Secondary | ICD-10-CM | POA: Diagnosis not present

## 2023-11-11 DIAGNOSIS — J3081 Allergic rhinitis due to animal (cat) (dog) hair and dander: Secondary | ICD-10-CM | POA: Diagnosis not present

## 2023-11-11 DIAGNOSIS — J3089 Other allergic rhinitis: Secondary | ICD-10-CM | POA: Diagnosis not present

## 2023-11-18 DIAGNOSIS — J3089 Other allergic rhinitis: Secondary | ICD-10-CM | POA: Diagnosis not present

## 2023-11-18 DIAGNOSIS — J301 Allergic rhinitis due to pollen: Secondary | ICD-10-CM | POA: Diagnosis not present

## 2023-11-24 ENCOUNTER — Other Ambulatory Visit: Payer: Self-pay | Admitting: Internal Medicine

## 2023-11-25 DIAGNOSIS — J301 Allergic rhinitis due to pollen: Secondary | ICD-10-CM | POA: Diagnosis not present

## 2023-11-25 DIAGNOSIS — J3081 Allergic rhinitis due to animal (cat) (dog) hair and dander: Secondary | ICD-10-CM | POA: Diagnosis not present

## 2023-11-25 DIAGNOSIS — J3089 Other allergic rhinitis: Secondary | ICD-10-CM | POA: Diagnosis not present

## 2023-12-03 DIAGNOSIS — J019 Acute sinusitis, unspecified: Secondary | ICD-10-CM | POA: Diagnosis not present

## 2023-12-03 DIAGNOSIS — R0982 Postnasal drip: Secondary | ICD-10-CM | POA: Diagnosis not present

## 2023-12-07 DIAGNOSIS — J301 Allergic rhinitis due to pollen: Secondary | ICD-10-CM | POA: Diagnosis not present

## 2023-12-08 ENCOUNTER — Ambulatory Visit: Payer: Self-pay

## 2023-12-08 DIAGNOSIS — J3089 Other allergic rhinitis: Secondary | ICD-10-CM | POA: Diagnosis not present

## 2023-12-08 DIAGNOSIS — J3081 Allergic rhinitis due to animal (cat) (dog) hair and dander: Secondary | ICD-10-CM | POA: Diagnosis not present

## 2023-12-08 NOTE — Telephone Encounter (Signed)
 Appt scheduled

## 2023-12-08 NOTE — Telephone Encounter (Signed)
 FYI Only or Action Required?: FYI only for provider.  Patient was last seen in primary care on 07/14/2023 by Ronnie Aloysius BRAVO, MD.  Called Nurse Triage reporting Heartburn, Bloated, and Cough.  Symptoms began today.  Interventions attempted: Prescription medications: pantoprazole .  Symptoms are: gradually improving.  Triage Disposition: Home Care  Patient/caregiver understands and will follow disposition?: Yes  Message from [facility] sent at 12/08/2023  8:23 AM EDT  Reason for Triage: Pt calling in with stomach problems, sinus issues, drainage in throat. No mentions of pain or anything, but was wanting a call back from nurse. Requested an appointment to be scheduled which I did for tomorrow. But also requested call back from nurse.   Reason for Disposition  Abdomen BLOATING (e.g., feeling full or gassy; no visible swelling)  Answer Assessment - Initial Assessment Questions Patient states that he woke up in the middle of the night feeling weak with pressure and bloating in his abdomen that made it uncomfortable to breathe.  He also felt congested in his chest and throat. He belched several times and the pressure relieved.   Mentions that he painted his garage with acrylic paint two weeks ago and symptoms started after this.   1. SYMPTOM: What's the main symptom you're concerned about? (e.g., abdomen bloating, swelling)     Bloating, acid reflux 2. ONSET: When did bloating  start?     3-4 days ago 3. SEVERITY: How bad is the bloating or swelling?     Very uncomfortable, more than normal 4. ABDOMEN PAIN:  Is there any abdomen pain? If Yes, ask: How bad is the pain?  (e.g., Scale 0-10; or none, mild, moderate, or severe)     denies 5. RELIEVING AND AGGRAVATING FACTORS: What makes it better or worse? (e.g., certain foods, lactose, medicines)     burping 6. GI HISTORY: Do you have any history of stomach or intestine problems? (e.g., bowel obstruction, cancer, irritable bowel)       reflux 7. CAUSE: What do you think is causing the bloating?      Unsure- has history or GERD and sleep apnea.  Has had sinus congestion lately.  No change to diet and does not eat late at night. Compliant with diet for GERD 8. OTHER SYMPTOMS: Do you have any other symptoms? (e.g., belching, blood in stool, breathing difficulty, constipation, diarrhea, fever, passing gas, vomiting, weight loss, white of eyes have turned yellow)     Belching, mid and low back pain, two Days since last BM, normally goes about once per day.  also uses CPAP for sleep apnea 9. PREGNANCY: Is there any chance you are pregnant? When was your last menstrual period?     N/a  Protocols used: Abdomen Bloating and Swelling-A-AH

## 2023-12-09 ENCOUNTER — Ambulatory Visit: Payer: Self-pay | Admitting: Medical

## 2023-12-09 ENCOUNTER — Ambulatory Visit: Admitting: Medical

## 2023-12-09 VITALS — BP 120/80 | HR 57 | Temp 97.7°F | Resp 15 | Ht 75.0 in | Wt 230.6 lb

## 2023-12-09 DIAGNOSIS — R5383 Other fatigue: Secondary | ICD-10-CM

## 2023-12-09 DIAGNOSIS — R140 Abdominal distension (gaseous): Secondary | ICD-10-CM

## 2023-12-09 DIAGNOSIS — T7840XA Allergy, unspecified, initial encounter: Secondary | ICD-10-CM

## 2023-12-09 DIAGNOSIS — J320 Chronic maxillary sinusitis: Secondary | ICD-10-CM | POA: Diagnosis not present

## 2023-12-09 LAB — VITAMIN B12: Vitamin B-12: 151 pg/mL — ABNORMAL LOW (ref 211–911)

## 2023-12-09 MED ORDER — METHYLPREDNISOLONE 4 MG PO TABS: ORAL_TABLET | ORAL | 0 refills | Status: DC

## 2023-12-09 NOTE — Progress Notes (Signed)
 333  Subjective:    Patient ID: Ronnie [name], male    DOB: Dec 01, 1960, 63 y.o.   MRN: [id number]  HPI   Ronnie [name] is a 63 year old male who presents with concerns of allergic reactions to epoxy floor paint.  He has been experiencing sinus issues and stomach bloating, which he attributes to potential allergic reactions to epoxy floor paint applied in his garage approximately two and a half weeks ago. The smell from the paint was strong for about a week and a half, and although he stopped going into the garage, his home office is adjacent to it. Despite having the room cleaned thoroughly, he continues to experience symptoms.  He visited urgent care last Wednesday, where he was diagnosed with a sinus infection with teeth pain and prescribed prednisone  (5 mg dose pack) and amoxicillin  (875 mg twice daily for 10 days). He is on the last day of prednisone  and has four more days of amoxicillin  remaining. He continues to experience mucus in his throat, eye irritation, and occasional bloating.  He describes the bloating as starting about a week ago, often occurring in the mornings or when he is in his office. He has a history of acid reflux for which he takes Protonix , but he has not experienced bloating since starting the medication until now. The bloating resolves after bowel movements, which have been normal the past two days.  He also reports decreased energy and fatigue over the past week.  No issues with taking full deep breaths. Mucus drainage in the mornings and occasional bloating.     Review of Systems  Respiratory:  Negative for cough, chest tightness, shortness of breath and wheezing.   Cardiovascular:  Negative for chest pain and palpitations.  Gastrointestinal:  Positive for abdominal pain. Negative for abdominal distention.       Bloated abdomen. Just started today.   Genitourinary:  Negative for dysuria.  Musculoskeletal:  Negative for back pain, myalgias and neck pain.   Skin:  Negative for pallor and rash.  Neurological:  Negative for seizures, facial asymmetry and weakness.  Psychiatric/Behavioral:  Negative for behavioral problems and decreased concentration. The patient is not nervous/anxious.      Past Medical History:  Diagnosis Date   Allergic rhinitis    Anxiety    DVT (deep venous thrombosis) (HCC)    RLE 2002 aprox, (-) anticoag panel   GERD (gastroesophageal reflux disease)    OSA on CPAP    Palpitations    on betablokers     Social History   Socioeconomic History   Marital status: Married    Spouse name: Not on file   Number of children: 1   Years of education: Not on file   Highest education level: Not on file  Occupational History   Occupation: consulting work  Tobacco Use   Smoking status: Never   Smokeless tobacco: Never  Vaping Use   Vaping status: Never Used  Substance and Sexual Activity   Alcohol use: Yes    Comment: rare   Drug use: No   Sexual activity: Not on file  Other Topics Concern   Not on file  Social History Narrative   Lives w/ wife   Child born 2000, gone to college    Lost father 01-2014            Social Drivers of Health   Financial Resource Strain: Not on file  Food Insecurity: Not on file  Transportation Needs: Not on  file  Physical Activity: Not on file  Stress: Not on file  Social Connections: Not on file  Intimate Partner Violence: Not on file    Past Surgical History:  Procedure Laterality Date   NASAL SEPTOPLASTY W/ TURBINOPLASTY     TONSILLECTOMY      Family History  Problem Relation Age of Onset   Colon cancer Father 26           Prostate cancer Father 68   Diabetes Neg Hx    Heart attack Neg Hx    Stroke Neg Hx     No Known Allergies  Current Outpatient Medications on File Prior to Visit  Medication Sig Dispense Refill   apixaban  (ELIQUIS ) 2.5 MG TABS tablet Take 1 tablet (2.5 mg total) by mouth 2 (two) times daily. 180 tablet 3   cetirizine (ZYRTEC) 10 MG  tablet daily.     fluticasone  (FLONASE ) 50 MCG/ACT nasal spray Place 2 sprays into both nostrils daily. 16 g 6   metoprolol  succinate (TOPROL -XL) 25 MG 24 hr tablet Take 1 tablet (25 mg total) by mouth daily as needed (palpitations). 90 tablet 2   Multiple Vitamin (MULTIVITAMIN) capsule Take 1 capsule by mouth daily.     NON FORMULARY Inject 1 Dose as directed once a week. Allergy Shot     pantoprazole  (PROTONIX ) 40 MG tablet Take 1 tablet (40 mg total) by mouth daily before breakfast. 90 tablet 1   EPINEPHrine 0.3 mg/0.3 mL IJ SOAJ injection See admin instructions. (Patient not taking: Reported on 12/09/2023)     No current facility-administered medications on file prior to visit.    BP 120/80   Pulse (!) 57   Temp 97.7 F (36.5 C) (Oral)   Resp 15   Ht 6' 3 (1.905 m)   Wt 230 lb 9.6 oz (104.6 kg)   SpO2 98%   BMI 28.82 kg/m        Objective:   Physical Exam  General Mental Status- Alert. General Appearance- Not in acute distress.   Skin General: Color- Normal Color. Moisture- Normal Moisture.  Neck Carotid Arteries- Normal color. Moisture- Normal Moisture. No carotid bruits. No JVD.  Chest and Lung Exam Auscultation: Breath Sounds:-CTA  Cardiovascular Auscultation:Rythm- RRRR Murmurs & Other Heart Sounds:Auscultation of the heart reveals- No Murmurs.  Abdomen Inspection:-Inspeection Normal. Palpation/Percussion:Note:No mass. Palpation and Percussion of the abdomen reveal- Non Tender, Non Distended + BS, no rebound or guarding.    Neurologic Cranial Nerve exam:- CN III-XII intact(No nystagmus), symmetric smile. Strength:- 5/5 equal and symmetric strength both upper and lower extremities.   Heent- no sinus pressure. Boggy turbinates.    Assessment & Plan:   Patient Instructions  Acute sinusitis (resolved now)and allergic reaction secondary to xylene/epoxy exposure Acute sinusitis and allergic reaction likely due to xylene exposure from epoxy application.  Symptoms included sinus tenderness, eye irritation, and throat discomfort. Previous prednisone  and amoxicillin  provided some relief. Exposure being eliminated by removing epoxy.(company removing product) Sinus pain resolved. - Prescribe Medrol  4 mg for 6 days. - Advise to work from a different room to avoid re-exposure until epoxy is removed. - Order blood work to check infection markers and eosinophils.  Fatigue possibly related to recent illness/chemical exposure Fatigue possibly related to recent chemical exposure and sinusitis. - Order blood work including B12 level to assess for underlying causes of fatigue.  Gastrointestinal symptoms (bloating) possibly related to chemical exposure Intermittent bloating possibly related to chemical exposure. GERD managed with Protonix . - Continue Protonix . -Advise to  monitor for new stomach symptoms and report if he occurs. -normal bowel pattern last 2 days.   Gastroesophageal reflux disease (GERD) GERD managed with Protonix . - Continue Protonix .  Follow up 7-10 days or sooner if needed   [employer], PA-C

## 2023-12-09 NOTE — Patient Instructions (Addendum)
 Acute sinusitis (resolved now)and allergic reaction secondary to xylene/epoxy exposure Acute sinusitis and allergic reaction likely due to xylene exposure from epoxy application. Symptoms included sinus tenderness, eye irritation, and throat discomfort. Previous prednisone  and amoxicillin  provided some relief. Exposure being eliminated by removing epoxy.(company removing product) Sinus pain resolved. - Prescribe Medrol  4 mg for 6 days. - Advise to work from a different room to avoid re-exposure until epoxy is removed. - Order blood work to check infection markers and eosinophils.  Fatigue possibly related to recent illness/chemical exposure Fatigue possibly related to recent chemical exposure and sinusitis. - Order blood work including B12 level to assess for underlying causes of fatigue.  Gastrointestinal symptoms (bloating) possibly related to chemical exposure Intermittent bloating possibly related to chemical exposure. GERD managed with Protonix . - Continue Protonix . -Advise to monitor for new stomach symptoms and report if he occurs. -normal bowel pattern last 2 days.   Gastroesophageal reflux disease (GERD) GERD managed with Protonix . - Continue Protonix .  Follow up 7-10 days or sooner if needed

## 2023-12-10 ENCOUNTER — Telehealth: Payer: Self-pay | Admitting: Internal Medicine

## 2023-12-10 LAB — CBC WITH DIFFERENTIAL/PLATELET
Absolute Lymphocytes: 1170 {cells}/uL (ref 850–3900)
Absolute Monocytes: 528 {cells}/uL (ref 200–950)
Basophils Absolute: 12 {cells}/uL (ref 0–200)
Basophils Relative: 0.2 %
Eosinophils Absolute: 12 {cells}/uL — ABNORMAL LOW (ref 15–500)
Eosinophils Relative: 0.2 %
HCT: 44 % (ref 38.5–50.0)
Hemoglobin: 14.2 g/dL (ref 13.2–17.1)
MCH: 30.5 pg (ref 27.0–33.0)
MCHC: 32.3 g/dL (ref 32.0–36.0)
MCV: 94.6 fL (ref 80.0–100.0)
MPV: 12.4 fL (ref 7.5–12.5)
Monocytes Relative: 8.8 %
Neutro Abs: 4278 {cells}/uL (ref 1500–7800)
Neutrophils Relative %: 71.3 %
Platelets: 201 Thousand/uL (ref 140–400)
RBC: 4.65 Million/uL (ref 4.20–5.80)
RDW: 11.5 % (ref 11.0–15.0)
Total Lymphocyte: 19.5 %
WBC: 6 Thousand/uL (ref 3.8–10.8)

## 2023-12-10 LAB — COMPLETE METABOLIC PANEL WITHOUT GFR
AG Ratio: 1.5 (calc) (ref 1.0–2.5)
ALT: 16 U/L (ref 9–46)
AST: 19 U/L (ref 10–35)
Albumin: 4.5 g/dL (ref 3.6–5.1)
Alkaline phosphatase (APISO): 68 U/L (ref 35–144)
BUN: 12 mg/dL (ref 7–25)
CO2: 25 mmol/L (ref 20–32)
Calcium: 9.4 mg/dL (ref 8.6–10.3)
Chloride: 104 mmol/L (ref 98–110)
Creat: 0.97 mg/dL (ref 0.70–1.35)
Globulin: 3 g/dL (ref 1.9–3.7)
Glucose, Bld: 84 mg/dL (ref 65–99)
Potassium: 4.5 mmol/L (ref 3.5–5.3)
Sodium: 140 mmol/L (ref 135–146)
Total Bilirubin: 1 mg/dL (ref 0.2–1.2)
Total Protein: 7.5 g/dL (ref 6.1–8.1)

## 2023-12-10 NOTE — Telephone Encounter (Signed)
 Hey does this pt need any lab orders for 10/24

## 2023-12-10 NOTE — Telephone Encounter (Signed)
 Defer to [place], he saw him yesterday.

## 2023-12-10 NOTE — Telephone Encounter (Signed)
 Spoke w/ Pt- he was actually needing nurse visit for B12 injections. Appt time and type changed.

## 2023-12-11 ENCOUNTER — Other Ambulatory Visit

## 2023-12-11 ENCOUNTER — Ambulatory Visit (INDEPENDENT_AMBULATORY_CARE_PROVIDER_SITE_OTHER)

## 2023-12-11 DIAGNOSIS — E538 Deficiency of other specified B group vitamins: Secondary | ICD-10-CM

## 2023-12-11 MED ORDER — CYANOCOBALAMIN 1000 MCG/ML IJ SOLN
1000.0000 ug | Freq: Once | INTRAMUSCULAR | Status: AC
  Administered 2023-12-11: 1000 ug via INTRAMUSCULAR

## 2023-12-11 NOTE — Progress Notes (Signed)
 Pt here for 1/4 weekly B12 injection per original per PCP  Last B12 level: 12/09/2023  B12 1000mcg given IM L, and pt tolerated injection well.  Next B12 injection scheduled for: 12/18/2023

## 2023-12-16 ENCOUNTER — Ambulatory Visit: Admitting: Medical

## 2023-12-18 ENCOUNTER — Other Ambulatory Visit

## 2023-12-18 ENCOUNTER — Ambulatory Visit (INDEPENDENT_AMBULATORY_CARE_PROVIDER_SITE_OTHER)

## 2023-12-18 DIAGNOSIS — E538 Deficiency of other specified B group vitamins: Secondary | ICD-10-CM

## 2023-12-18 MED ORDER — CYANOCOBALAMIN 1000 MCG/ML IJ SOLN
1000.0000 ug | Freq: Once | INTRAMUSCULAR | Status: AC
  Administered 2023-12-18: 1000 ug via INTRAMUSCULAR

## 2023-12-18 NOTE — Progress Notes (Signed)
 Pt here today for B12 injection week 2 of 4 per [place].  Cyanocobalamin 1000mcg 1mL injected into L deltoid. Pt tolerated injection well.    Next in 1 week.

## 2023-12-23 ENCOUNTER — Ambulatory Visit: Payer: Self-pay | Admitting: Hematology & Oncology

## 2023-12-23 ENCOUNTER — Inpatient Hospital Stay: Attending: Hematology & Oncology

## 2023-12-23 ENCOUNTER — Inpatient Hospital Stay: Admitting: Hematology & Oncology

## 2023-12-23 VITALS — BP 113/71 | HR 55 | Temp 97.8°F | Resp 16 | Wt 236.0 lb

## 2023-12-23 DIAGNOSIS — I824Y2 Acute embolism and thrombosis of unspecified deep veins of left proximal lower extremity: Secondary | ICD-10-CM | POA: Diagnosis not present

## 2023-12-23 DIAGNOSIS — Z86718 Personal history of other venous thrombosis and embolism: Secondary | ICD-10-CM | POA: Insufficient documentation

## 2023-12-23 DIAGNOSIS — Z7901 Long term (current) use of anticoagulants: Secondary | ICD-10-CM | POA: Diagnosis not present

## 2023-12-23 DIAGNOSIS — D6862 Lupus anticoagulant syndrome: Secondary | ICD-10-CM | POA: Insufficient documentation

## 2023-12-23 LAB — DIFFERENTIAL
Abs Immature Granulocytes: 0.01 K/uL (ref 0.00–0.07)
Basophils Absolute: 0 K/uL (ref 0.0–0.1)
Basophils Relative: 0 %
Eosinophils Absolute: 0 K/uL (ref 0.0–0.5)
Eosinophils Relative: 1 %
Immature Granulocytes: 0 %
Lymphocytes Relative: 33 %
Lymphs Abs: 1 K/uL (ref 0.7–4.0)
Monocytes Absolute: 0.4 K/uL (ref 0.1–1.0)
Monocytes Relative: 12 %
Neutro Abs: 1.7 K/uL (ref 1.7–7.7)
Neutrophils Relative %: 54 %

## 2023-12-23 LAB — CMP (CANCER CENTER ONLY)
ALT: 21 U/L (ref 0–44)
AST: 23 U/L (ref 15–41)
Albumin: 4 g/dL (ref 3.5–5.0)
Alkaline Phosphatase: 59 U/L (ref 38–126)
Anion gap: 11 (ref 5–15)
BUN: 6 mg/dL (ref 6–20)
CO2: 22 mmol/L (ref 22–32)
Calcium: 8.7 mg/dL — ABNORMAL LOW (ref 8.9–10.3)
Chloride: 109 mmol/L (ref 98–111)
Creatinine: 0.95 mg/dL (ref 0.61–1.24)
GFR, Estimated: >60 mL/min (ref >60)
Glucose, Bld: 106 mg/dL — ABNORMAL HIGH (ref 70–99)
Potassium: 4.1 mmol/L (ref 3.5–5.1)
Sodium: 141 mmol/L (ref 135–145)
Total Bilirubin: 0.6 mg/dL (ref 0.0–1.2)
Total Protein: 6.5 g/dL (ref 6.5–8.1)

## 2023-12-23 LAB — D-DIMER, QUANTITATIVE: D-Dimer, Quant: <0.27 ug{FEU}/mL (ref 0.00–0.50)

## 2023-12-23 LAB — CBC (CANCER CENTER ONLY)
HCT: 37.9 % — ABNORMAL LOW (ref 39.0–52.0)
Hemoglobin: 12.4 g/dL — ABNORMAL LOW (ref 13.0–17.0)
MCH: 30.8 pg (ref 26.0–34.0)
MCHC: 32.7 g/dL (ref 30.0–36.0)
MCV: 94.3 fL (ref 80.0–100.0)
Platelet Count: 157 K/uL (ref 150–400)
RBC: 4.02 MIL/uL — ABNORMAL LOW (ref 4.22–5.81)
RDW: 11.9 % (ref 11.5–15.5)
WBC Count: 3.1 K/uL — ABNORMAL LOW (ref 4.0–10.5)
nRBC: 0 % (ref 0.0–0.2)
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
## 2023-12-25 ENCOUNTER — Other Ambulatory Visit

## 2023-12-25 ENCOUNTER — Ambulatory Visit

## 2023-12-25 DIAGNOSIS — E538 Deficiency of other specified B group vitamins: Secondary | ICD-10-CM

## 2023-12-25 MED ORDER — CYANOCOBALAMIN 1000 MCG/ML IJ SOLN
1000.0000 ug | Freq: Once | INTRAMUSCULAR | Status: AC
  Administered 2023-12-25: 1000 ug via INTRAMUSCULAR

## 2023-12-25 NOTE — Progress Notes (Signed)
 Pt here today for B12 injection week 3 of 4 per [place].  Cyanocobalamin 1000mcg 1mL injected into L deltoid. Pt tolerated injection well.   Next in 1 week.

## 2023-12-31 DIAGNOSIS — G4733 Obstructive sleep apnea (adult) (pediatric): Secondary | ICD-10-CM | POA: Diagnosis not present

## 2024-01-01 ENCOUNTER — Other Ambulatory Visit

## 2024-01-01 ENCOUNTER — Ambulatory Visit (INDEPENDENT_AMBULATORY_CARE_PROVIDER_SITE_OTHER)

## 2024-01-01 DIAGNOSIS — E538 Deficiency of other specified B group vitamins: Secondary | ICD-10-CM | POA: Diagnosis not present

## 2024-01-01 MED ORDER — CYANOCOBALAMIN 1000 MCG/ML IJ SOLN
1000.0000 ug | Freq: Once | INTRAMUSCULAR | Status: AC
  Administered 2024-01-01: 1000 ug via INTRAMUSCULAR

## 2024-01-01 NOTE — Progress Notes (Signed)
 Pt here for monthly B12 injection per original order dated 12/09/2023: Per [place] Maxwell Your b12 level is  very low.Recommend getting set up for weekly b12 vitamin injection over next 4 weeks followed by monthly injection for 5 months   Last B12 injection:12/25/2023  Last B12 level: 12/09/2023   B12 1000mcg given IM, and pt tolerated injection well.  Next B12 injection scheduled for: 02/03/24 @ 9:00 AM

## 2024-01-20 DIAGNOSIS — J301 Allergic rhinitis due to pollen: Secondary | ICD-10-CM | POA: Diagnosis not present

## 2024-01-20 DIAGNOSIS — J3089 Other allergic rhinitis: Secondary | ICD-10-CM | POA: Diagnosis not present

## 2024-01-20 DIAGNOSIS — J3081 Allergic rhinitis due to animal (cat) (dog) hair and dander: Secondary | ICD-10-CM | POA: Diagnosis not present

## 2024-01-27 DIAGNOSIS — J301 Allergic rhinitis due to pollen: Secondary | ICD-10-CM | POA: Diagnosis not present

## 2024-01-27 DIAGNOSIS — J3089 Other allergic rhinitis: Secondary | ICD-10-CM | POA: Diagnosis not present

## 2024-01-30 DIAGNOSIS — G4733 Obstructive sleep apnea (adult) (pediatric): Secondary | ICD-10-CM | POA: Diagnosis not present

## 2024-02-03 ENCOUNTER — Ambulatory Visit

## 2024-02-03 DIAGNOSIS — E538 Deficiency of other specified B group vitamins: Secondary | ICD-10-CM | POA: Diagnosis not present

## 2024-02-03 MED ORDER — CYANOCOBALAMIN 1000 MCG/ML IJ SOLN
1000.0000 ug | Freq: Once | INTRAMUSCULAR | Status: AC
  Administered 2024-02-03: 09:00:00 1000 ug via INTRAMUSCULAR

## 2024-02-03 NOTE — Progress Notes (Signed)
 Patient is here for monthly B12 injection per original order dated: 12/09/2023: Per [place] Maxwell Your b12 level is  very low.Recommend getting set up for weekly b12 vitamin injection over next 4 weeks followed by monthly injection for 5 months.  Last B12 injection: 01/01/2024  Last B12 level: 12/09/2023  B12 1000mcg given IM, Left deltoid, and patient tolerated injection well.  Next B12 injection scheduled for:   January 20th, 2026 @9 :00 am

## 2024-03-08 ENCOUNTER — Ambulatory Visit (INDEPENDENT_AMBULATORY_CARE_PROVIDER_SITE_OTHER)

## 2024-03-08 DIAGNOSIS — E538 Deficiency of other specified B group vitamins: Secondary | ICD-10-CM

## 2024-03-08 MED ORDER — CYANOCOBALAMIN 1000 MCG/ML IJ SOLN
1000.0000 ug | Freq: Once | INTRAMUSCULAR | Status: AC
  Administered 2024-03-08: 1000 ug via INTRAMUSCULAR

## 2024-03-08 NOTE — Addendum Note (Signed)
 Addended by: Dorien Bessent C on: 03/08/2024 10:18 AM   Modules accepted: Orders

## 2024-03-08 NOTE — Progress Notes (Signed)
 Pt here for monthly B12 injection per original order dated: 12/09/2023: Per [place] Maxwell Your b12 level is  very low.Recommend getting set up for weekly b12 vitamin injection over next 4 weeks followed by monthly injection for 5 months.  Last B12 injection:02/03/2024  Last B12 level: 12/09/2023   B12 1000mcg given IM,left deltoid and pt tolerated injection well.  Next B12 injection scheduled for: 04/08/2024

## 2024-04-08 ENCOUNTER — Ambulatory Visit

## 2024-06-21 ENCOUNTER — Inpatient Hospital Stay

## 2024-06-21 ENCOUNTER — Inpatient Hospital Stay: Admitting: Hematology & Oncology

## 2024-07-19 ENCOUNTER — Encounter: Admitting: Internal Medicine
